# Patient Record
Sex: Female | Born: 1960
Health system: Southern US, Community
[De-identification: ages and names within clinical notes are randomized; demographics above are authoritative.]

## PROBLEM LIST (undated history)

## (undated) DIAGNOSIS — J302 Other seasonal allergic rhinitis: Secondary | ICD-10-CM

## (undated) DIAGNOSIS — R011 Cardiac murmur, unspecified: Secondary | ICD-10-CM

## (undated) DIAGNOSIS — E785 Hyperlipidemia, unspecified: Secondary | ICD-10-CM

## (undated) DIAGNOSIS — Z79890 Hormone replacement therapy (postmenopausal): Secondary | ICD-10-CM

## (undated) DIAGNOSIS — E039 Hypothyroidism, unspecified: Secondary | ICD-10-CM

## (undated) HISTORY — PX: LIPOMA EXCISION: SHX5283

## (undated) HISTORY — DX: Other seasonal allergic rhinitis: J30.2

## (undated) HISTORY — DX: Hyperlipidemia, unspecified: E78.5

## (undated) HISTORY — DX: Hormone replacement therapy: Z79.890

## (undated) HISTORY — DX: Hypothyroidism, unspecified: E03.9

## (undated) HISTORY — DX: Cardiac murmur, unspecified: R01.1

---

## 1998-03-21 ENCOUNTER — Other Ambulatory Visit: Admission: RE | Admit: 1998-03-21 | Discharge: 1998-03-21 | Payer: Self-pay | Admitting: Orthopedic Surgery

## 1998-11-30 HISTORY — PX: HAND SURGERY: SHX662

## 2000-01-01 ENCOUNTER — Other Ambulatory Visit: Admission: RE | Admit: 2000-01-01 | Discharge: 2000-01-01 | Payer: Self-pay | Admitting: Obstetrics & Gynecology

## 2001-04-26 ENCOUNTER — Encounter: Payer: Self-pay | Admitting: Internal Medicine

## 2002-01-23 ENCOUNTER — Other Ambulatory Visit: Admission: RE | Admit: 2002-01-23 | Discharge: 2002-01-23 | Payer: Self-pay | Admitting: Obstetrics and Gynecology

## 2003-01-24 ENCOUNTER — Other Ambulatory Visit: Admission: RE | Admit: 2003-01-24 | Discharge: 2003-01-24 | Payer: Self-pay | Admitting: Obstetrics and Gynecology

## 2003-12-01 HISTORY — PX: LIPOMA EXCISION: SHX5283

## 2004-01-21 ENCOUNTER — Other Ambulatory Visit: Admission: RE | Admit: 2004-01-21 | Discharge: 2004-01-21 | Payer: Self-pay | Admitting: Internal Medicine

## 2004-08-29 ENCOUNTER — Encounter: Payer: Self-pay | Admitting: Internal Medicine

## 2004-12-31 ENCOUNTER — Ambulatory Visit: Payer: Self-pay | Admitting: Internal Medicine

## 2005-01-07 ENCOUNTER — Ambulatory Visit: Payer: Self-pay | Admitting: Internal Medicine

## 2005-01-07 ENCOUNTER — Other Ambulatory Visit: Admission: RE | Admit: 2005-01-07 | Discharge: 2005-01-07 | Payer: Self-pay | Admitting: Internal Medicine

## 2005-01-13 ENCOUNTER — Encounter (HOSPITAL_COMMUNITY): Admission: RE | Admit: 2005-01-13 | Discharge: 2005-04-13 | Payer: Self-pay | Admitting: Internal Medicine

## 2005-02-23 ENCOUNTER — Encounter: Admission: RE | Admit: 2005-02-23 | Discharge: 2005-02-23 | Payer: Self-pay | Admitting: Endocrinology

## 2005-03-02 ENCOUNTER — Encounter: Payer: Self-pay | Admitting: Internal Medicine

## 2005-03-04 ENCOUNTER — Encounter: Admission: RE | Admit: 2005-03-04 | Discharge: 2005-03-04 | Payer: Self-pay | Admitting: Endocrinology

## 2005-03-04 ENCOUNTER — Encounter (INDEPENDENT_AMBULATORY_CARE_PROVIDER_SITE_OTHER): Payer: Self-pay | Admitting: *Deleted

## 2005-03-04 ENCOUNTER — Other Ambulatory Visit: Admission: RE | Admit: 2005-03-04 | Discharge: 2005-03-04 | Payer: Self-pay | Admitting: Interventional Radiology

## 2005-03-24 ENCOUNTER — Encounter (HOSPITAL_COMMUNITY): Admission: RE | Admit: 2005-03-24 | Discharge: 2005-06-22 | Payer: Self-pay | Admitting: Endocrinology

## 2006-01-04 ENCOUNTER — Ambulatory Visit: Payer: Self-pay | Admitting: Internal Medicine

## 2006-01-11 ENCOUNTER — Ambulatory Visit: Payer: Self-pay | Admitting: Internal Medicine

## 2006-01-11 ENCOUNTER — Encounter (INDEPENDENT_AMBULATORY_CARE_PROVIDER_SITE_OTHER): Payer: Self-pay | Admitting: *Deleted

## 2006-01-11 ENCOUNTER — Other Ambulatory Visit: Admission: RE | Admit: 2006-01-11 | Discharge: 2006-01-11 | Payer: Self-pay | Admitting: Internal Medicine

## 2006-01-20 ENCOUNTER — Ambulatory Visit: Payer: Self-pay | Admitting: Internal Medicine

## 2006-02-22 IMAGING — US US BIOPSY
1 series · 6 of 6 positions shown · non-contrast
Comparison: none

CLINICAL DATA: 43-year-old female with cold nodule mid to inferior aspect of the right lobe of the thyroid.  Request to perform fine needle aspiration. 
Comparison; (1.)  Nuclear medicine thyroid imaging and uptake 01/14/05 - [REDACTED]    (2.) Thyroid ultrasound 02/23/05 ? [REDACTED]. 
Procedure:
ULTRASOUND GUIDED FINE NEEDLE ASPIRATION RIGHT LOBE OF THE THYROID 03/04/06.  
The above procedure was discussed with the patient and written informed consent was obtained. 
Ultrasound was then performed to localize and mark an adequate site for the biopsy and this area was marked.  eviously described and imaged nodular lesion within the lateral aspect of the right lobe of the thyroid was imaged.  The maximum diameter measurement obtained was 6.3 mm.  The patient was prepped and draped in a normal sterile fashion, 1 percent Lidocaine was used for local anesthesia.  Using direct ultrasound guidance three passes were made using a 25 gauge hypodermic needle into this dominant nodule.  Ultrasound confirmed placement of the needle on all three occasions.  The specimens were given to pathology for further analysis.  Postprocedure imaging demonstrated no hematoma or immediate complication.  The patient tolerated the procedure well.

[Series 1: unknown · 0.07mm/px · 6 of 6 slices shown]
[im 1/6]
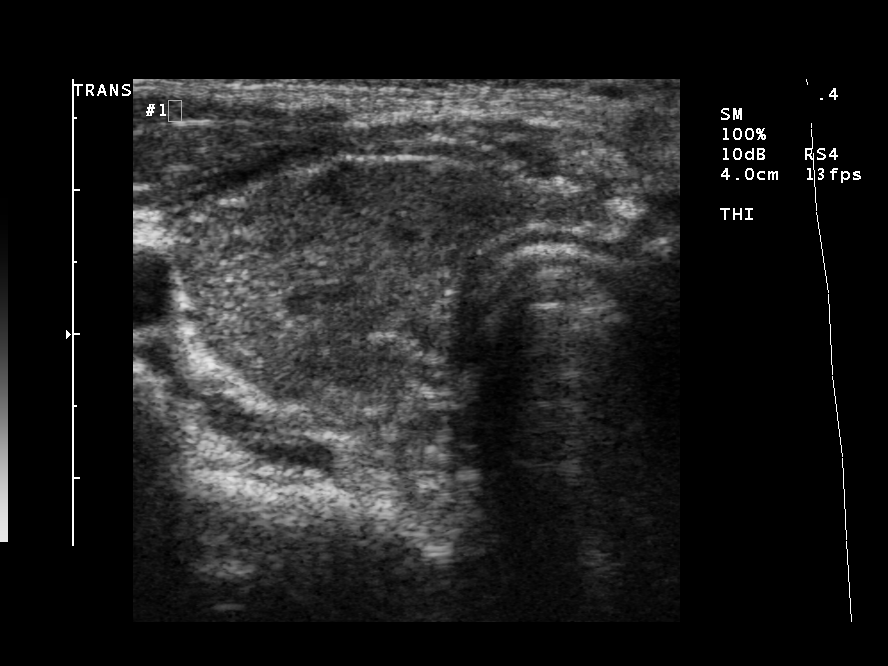
[im 2/6]
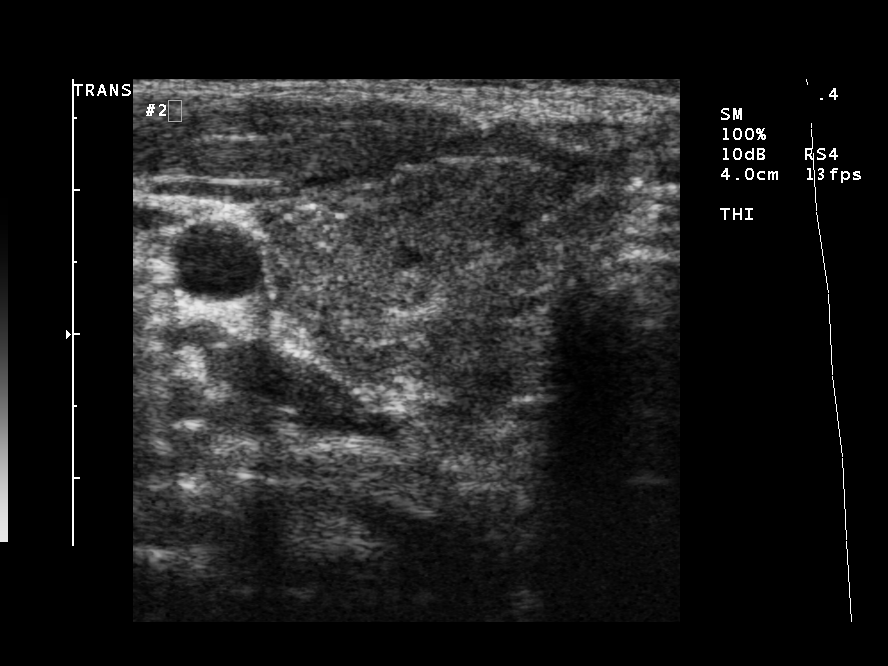
[im 3/6]
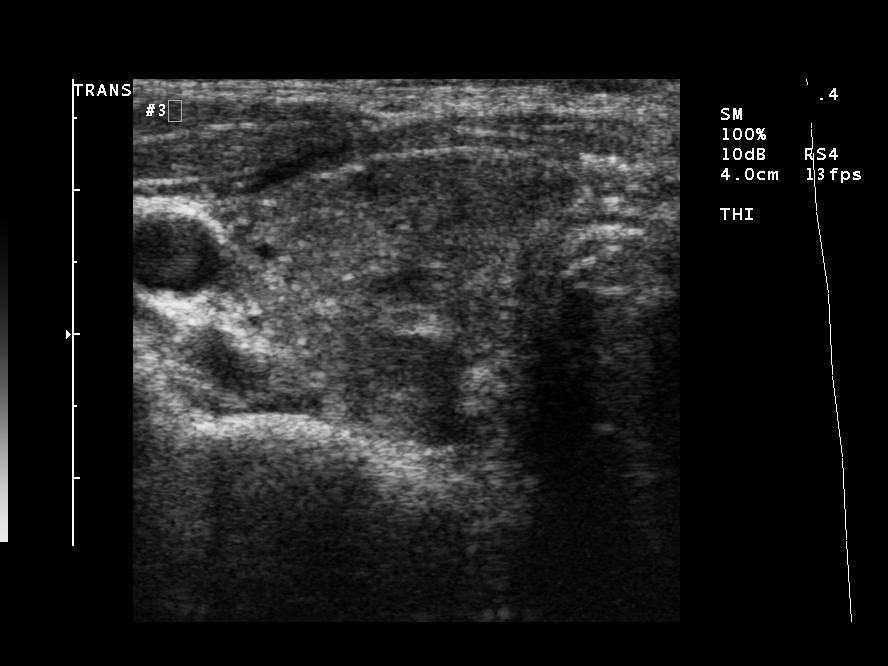
[im 4/6]
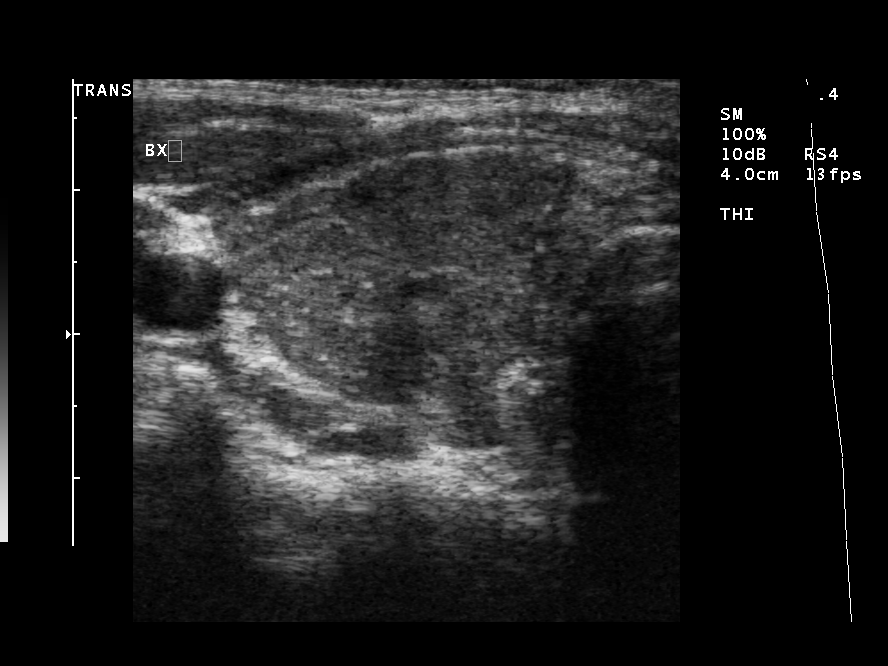
[im 5/6]
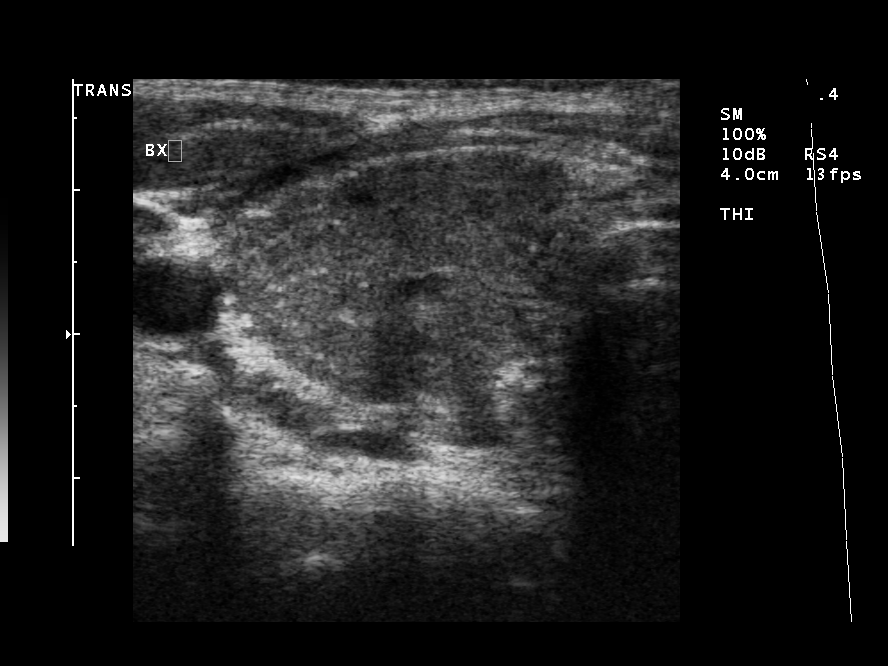
[im 6/6]
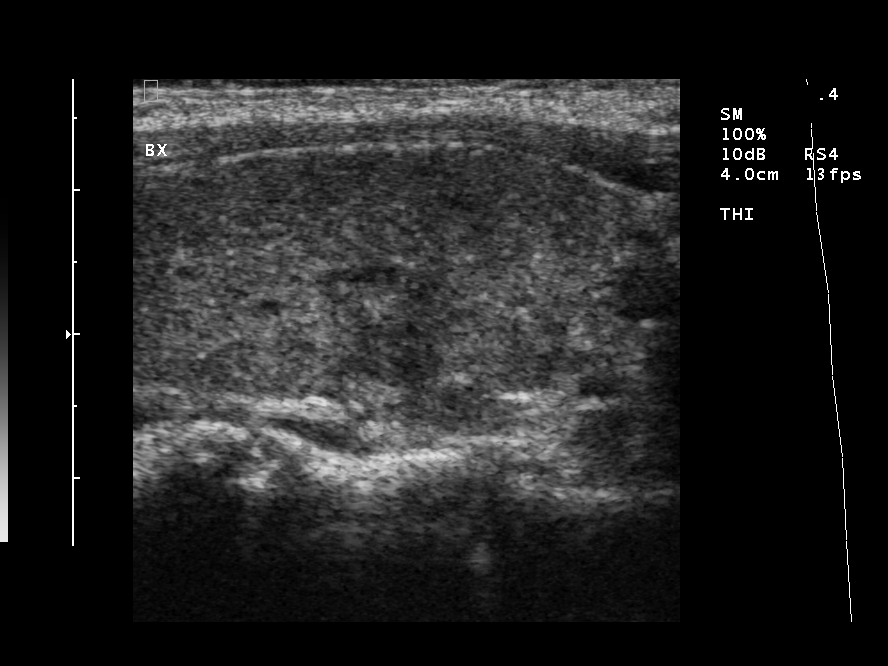

[6 of 6 positions shown; findings below may reference images not displayed]

IMPRESSION: Successful ultrasound guided fine needle aspiration right nodule of thyroid.  Final pathology pending.

## 2006-03-14 IMAGING — NM NM RAI THERAPY FOR HYPERTHYROIDISM
1 series · 1 of 1 positions shown · non-contrast
Comparison: none

CLINICAL DATA: 43 year old with Graves? disease.
 NUCLEAR MEDICINE RADIOACTIVE IODINE THERAPY FOR HYPERTHYROIDISM:
 The patient received an 0-TRT capsule which contained 15 millicuries of activity.  Appropriate precautions and instructions were reviewed with the patient.

[Series 1: st statics,dual det. · 2.48mm/px · 1 of 1 slices shown]
[im 1/1]
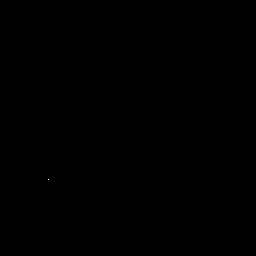

[1 of 1 positions shown; findings below may reference images not displayed]

IMPRESSION: Treatment of hyperthyroidism with 15 millicuries of 0-TRT orally.

## 2007-01-11 ENCOUNTER — Ambulatory Visit: Payer: Self-pay | Admitting: Internal Medicine

## 2007-01-11 LAB — CONVERTED CEMR LAB
Albumin: 3.4 g/dL — ABNORMAL LOW (ref 3.5–5.2)
Alkaline Phosphatase: 51 units/L (ref 39–117)
BUN: 10 mg/dL (ref 6–23)
Basophils Absolute: 0.1 10*3/uL (ref 0.0–0.1)
Cholesterol: 268 mg/dL (ref 0–200)
Creatinine, Ser: 0.9 mg/dL (ref 0.4–1.2)
GFR calc Af Amer: 87 mL/min
HDL: 65 mg/dL (ref >39.0)
Hemoglobin: 12.9 g/dL (ref 12.0–15.0)
MCHC: 34.2 g/dL (ref 30.0–36.0)
Monocytes Absolute: 0.6 10*3/uL (ref 0.2–0.7)
Monocytes Relative: 5.6 % (ref 3.0–11.0)
Potassium: 4.7 meq/L (ref 3.5–5.1)
RDW: 13.1 % (ref 11.5–14.6)
TSH: 5.4 microintl units/mL (ref 0.35–5.50)
Total Bilirubin: 0.7 mg/dL (ref 0.3–1.2)
Total CHOL/HDL Ratio: 4.1
Triglycerides: 205 mg/dL (ref 0–149)

## 2007-01-18 ENCOUNTER — Ambulatory Visit: Payer: Self-pay | Admitting: Internal Medicine

## 2007-01-18 ENCOUNTER — Other Ambulatory Visit: Admission: RE | Admit: 2007-01-18 | Discharge: 2007-01-18 | Payer: Self-pay | Admitting: Internal Medicine

## 2007-01-18 ENCOUNTER — Encounter: Payer: Self-pay | Admitting: Internal Medicine

## 2007-01-18 LAB — CONVERTED CEMR LAB

## 2007-05-25 ENCOUNTER — Encounter: Payer: Self-pay | Admitting: Internal Medicine

## 2007-05-25 DIAGNOSIS — E0590 Thyrotoxicosis, unspecified without thyrotoxic crisis or storm: Secondary | ICD-10-CM | POA: Insufficient documentation

## 2007-05-25 DIAGNOSIS — R319 Hematuria, unspecified: Secondary | ICD-10-CM | POA: Insufficient documentation

## 2007-07-12 ENCOUNTER — Ambulatory Visit: Payer: Self-pay | Admitting: Internal Medicine

## 2007-07-12 LAB — CONVERTED CEMR LAB
Cholesterol: 240 mg/dL (ref 0–200)
HDL: 54.5 mg/dL (ref >39.0)
Triglycerides: 167 mg/dL — ABNORMAL HIGH (ref 0–149)

## 2007-07-19 ENCOUNTER — Ambulatory Visit: Payer: Self-pay | Admitting: Internal Medicine

## 2007-07-19 DIAGNOSIS — E039 Hypothyroidism, unspecified: Secondary | ICD-10-CM | POA: Insufficient documentation

## 2007-07-19 DIAGNOSIS — E785 Hyperlipidemia, unspecified: Secondary | ICD-10-CM | POA: Insufficient documentation

## 2007-07-20 ENCOUNTER — Encounter: Payer: Self-pay | Admitting: Internal Medicine

## 2007-11-15 ENCOUNTER — Encounter: Payer: Self-pay | Admitting: Internal Medicine

## 2008-01-11 ENCOUNTER — Encounter: Payer: Self-pay | Admitting: Internal Medicine

## 2008-01-11 ENCOUNTER — Encounter (INDEPENDENT_AMBULATORY_CARE_PROVIDER_SITE_OTHER): Payer: Self-pay | Admitting: Surgery

## 2008-01-11 ENCOUNTER — Ambulatory Visit (HOSPITAL_BASED_OUTPATIENT_CLINIC_OR_DEPARTMENT_OTHER): Admission: RE | Admit: 2008-01-11 | Discharge: 2008-01-11 | Payer: Self-pay | Admitting: Surgery

## 2008-01-17 ENCOUNTER — Encounter: Payer: Self-pay | Admitting: Internal Medicine

## 2008-01-17 ENCOUNTER — Ambulatory Visit: Payer: Self-pay | Admitting: Family Medicine

## 2008-01-17 DIAGNOSIS — D179 Benign lipomatous neoplasm, unspecified: Secondary | ICD-10-CM | POA: Insufficient documentation

## 2008-01-17 LAB — CONVERTED CEMR LAB
Nitrite: NEGATIVE
Protein, U semiquant: NEGATIVE
Urobilinogen, UA: 0.2
WBC Urine, dipstick: NEGATIVE

## 2008-01-18 LAB — CONVERTED CEMR LAB
ALT: 17 units/L (ref 0–35)
AST: 18 units/L (ref 0–37)
Albumin: 3.4 g/dL — ABNORMAL LOW (ref 3.5–5.2)
Basophils Absolute: 0.1 10*3/uL (ref 0.0–0.1)
Calcium: 9.5 mg/dL (ref 8.4–10.5)
Chloride: 103 meq/L (ref 96–112)
Creatinine, Ser: 0.9 mg/dL (ref 0.4–1.2)
Direct LDL: 166.2 mg/dL
Eosinophils Relative: 2.7 % (ref 0.0–5.0)
Glucose, Bld: 94 mg/dL (ref 70–99)
HCT: 38 % (ref 36.0–46.0)
Neutrophils Relative %: 70.4 % (ref 43.0–77.0)
Platelets: 252 10*3/uL (ref 150–400)
RBC: 4.15 M/uL (ref 3.87–5.11)
RDW: 13.3 % (ref 11.5–14.6)
Sodium: 138 meq/L (ref 135–145)
Total Bilirubin: 0.5 mg/dL (ref 0.3–1.2)
Total CHOL/HDL Ratio: 3.9
Triglycerides: 157 mg/dL — ABNORMAL HIGH (ref 0–149)
WBC: 10.3 10*3/uL (ref 4.5–10.5)

## 2008-01-23 ENCOUNTER — Ambulatory Visit: Payer: Self-pay | Admitting: Internal Medicine

## 2008-01-23 ENCOUNTER — Encounter: Payer: Self-pay | Admitting: Internal Medicine

## 2008-01-23 ENCOUNTER — Other Ambulatory Visit: Admission: RE | Admit: 2008-01-23 | Discharge: 2008-01-23 | Payer: Self-pay | Admitting: Internal Medicine

## 2008-03-02 ENCOUNTER — Ambulatory Visit: Payer: Self-pay | Admitting: Internal Medicine

## 2008-03-04 LAB — CONVERTED CEMR LAB
Albumin: 3.4 g/dL — ABNORMAL LOW (ref 3.5–5.2)
Alkaline Phosphatase: 48 units/L (ref 39–117)
HDL: 58.9 mg/dL (ref >39.0)
LDL Cholesterol: 103 mg/dL — ABNORMAL HIGH (ref 0–99)
Total CHOL/HDL Ratio: 3.3
Total Protein: 6.7 g/dL (ref 6.0–8.3)
Triglycerides: 158 mg/dL — ABNORMAL HIGH (ref 0–149)
VLDL: 32 mg/dL (ref 0–40)

## 2008-03-12 ENCOUNTER — Ambulatory Visit: Payer: Self-pay | Admitting: Internal Medicine

## 2008-03-12 LAB — CONVERTED CEMR LAB
Cholesterol, target level: 200 mg/dL
HDL goal, serum: 40 mg/dL
LDL Goal: 160 mg/dL

## 2008-07-18 ENCOUNTER — Encounter: Payer: Self-pay | Admitting: Internal Medicine

## 2008-07-30 ENCOUNTER — Encounter: Payer: Self-pay | Admitting: Internal Medicine

## 2008-09-04 ENCOUNTER — Ambulatory Visit: Payer: Self-pay | Admitting: Internal Medicine

## 2008-09-04 LAB — CONVERTED CEMR LAB
ALT: 17 units/L (ref 0–35)
AST: 21 units/L (ref 0–37)
Alkaline Phosphatase: 53 units/L (ref 39–117)
Bilirubin, Direct: 0.1 mg/dL (ref 0.0–0.3)
Cholesterol: 194 mg/dL (ref 0–200)
Total Bilirubin: 0.5 mg/dL (ref 0.3–1.2)
Total Protein: 6.5 g/dL (ref 6.0–8.3)

## 2008-09-24 ENCOUNTER — Ambulatory Visit: Payer: Self-pay | Admitting: Internal Medicine

## 2008-09-24 DIAGNOSIS — R351 Nocturia: Secondary | ICD-10-CM | POA: Insufficient documentation

## 2009-01-29 ENCOUNTER — Ambulatory Visit: Payer: Self-pay | Admitting: Internal Medicine

## 2009-01-29 LAB — CONVERTED CEMR LAB
Albumin: 3.4 g/dL — ABNORMAL LOW (ref 3.5–5.2)
BUN: 13 mg/dL (ref 6–23)
Calcium: 9 mg/dL (ref 8.4–10.5)
Cholesterol: 206 mg/dL (ref 0–200)
Eosinophils Relative: 3.3 % (ref 0.0–5.0)
GFR calc Af Amer: 86 mL/min
GFR calc non Af Amer: 71 mL/min
Glucose, Bld: 97 mg/dL (ref 70–99)
HCT: 36.7 % (ref 36.0–46.0)
Hemoglobin: 12.4 g/dL (ref 12.0–15.0)
Lymphocytes Relative: 31.1 % (ref 12.0–46.0)
Monocytes Relative: 7.3 % (ref 3.0–12.0)
Nitrite: NEGATIVE
Platelets: 229 10*3/uL (ref 150–400)
Potassium: 3.9 meq/L (ref 3.5–5.1)
Protein, U semiquant: NEGATIVE
RDW: 12.8 % (ref 11.5–14.6)
Sodium: 139 meq/L (ref 135–145)
TSH: 1.08 microintl units/mL (ref 0.35–5.50)
Total CHOL/HDL Ratio: 3.6
Total Protein: 6.6 g/dL (ref 6.0–8.3)
Urobilinogen, UA: 0.2
VLDL: 32 mg/dL (ref 0–40)
WBC: 7.9 10*3/uL (ref 4.5–10.5)

## 2009-02-05 ENCOUNTER — Other Ambulatory Visit: Admission: RE | Admit: 2009-02-05 | Discharge: 2009-02-05 | Payer: Self-pay | Admitting: Internal Medicine

## 2009-02-05 ENCOUNTER — Encounter: Payer: Self-pay | Admitting: Internal Medicine

## 2009-02-05 ENCOUNTER — Ambulatory Visit: Payer: Self-pay | Admitting: Internal Medicine

## 2009-07-01 ENCOUNTER — Encounter: Payer: Self-pay | Admitting: Internal Medicine

## 2009-08-12 ENCOUNTER — Encounter: Payer: Self-pay | Admitting: Internal Medicine

## 2009-08-12 LAB — HM MAMMOGRAPHY

## 2009-08-20 ENCOUNTER — Encounter: Payer: Self-pay | Admitting: Internal Medicine

## 2010-02-03 ENCOUNTER — Ambulatory Visit: Payer: Self-pay | Admitting: Internal Medicine

## 2010-02-03 LAB — CONVERTED CEMR LAB
ALT: 16 units/L (ref 0–35)
AST: 19 units/L (ref 0–37)
Albumin: 3.3 g/dL — ABNORMAL LOW (ref 3.5–5.2)
Basophils Absolute: 0.1 10*3/uL (ref 0.0–0.1)
Bilirubin Urine: NEGATIVE
CO2: 28 meq/L (ref 19–32)
Chloride: 105 meq/L (ref 96–112)
Cholesterol: 175 mg/dL (ref 0–200)
GFR calc non Af Amer: 70.54 mL/min (ref >60)
Glucose, Bld: 104 mg/dL — ABNORMAL HIGH (ref 70–99)
Glucose, Urine, Semiquant: NEGATIVE
HCT: 37.7 % (ref 36.0–46.0)
Hemoglobin: 12.4 g/dL (ref 12.0–15.0)
Lymphs Abs: 2.2 10*3/uL (ref 0.7–4.0)
MCV: 96.2 fL (ref 78.0–100.0)
Monocytes Absolute: 0.5 10*3/uL (ref 0.1–1.0)
Monocytes Relative: 5.8 % (ref 3.0–12.0)
Neutro Abs: 5.9 10*3/uL (ref 1.4–7.7)
Potassium: 3.9 meq/L (ref 3.5–5.1)
RDW: 12.7 % (ref 11.5–14.6)
Sodium: 139 meq/L (ref 135–145)
Specific Gravity, Urine: 1.02
TSH: 0.88 microintl units/mL (ref 0.35–5.50)
VLDL: 40 mg/dL (ref 0.0–40.0)
WBC Urine, dipstick: NEGATIVE
pH: 6

## 2010-02-10 ENCOUNTER — Other Ambulatory Visit: Admission: RE | Admit: 2010-02-10 | Discharge: 2010-02-10 | Payer: Self-pay | Admitting: Internal Medicine

## 2010-02-10 ENCOUNTER — Ambulatory Visit: Payer: Self-pay | Admitting: Internal Medicine

## 2010-02-10 LAB — CONVERTED CEMR LAB
Ketones, urine, test strip: NEGATIVE
Nitrite: NEGATIVE
Specific Gravity, Urine: 1.015
WBC Urine, dipstick: NEGATIVE

## 2010-08-19 ENCOUNTER — Encounter: Payer: Self-pay | Admitting: Internal Medicine

## 2010-11-30 HISTORY — PX: COLONOSCOPY: SHX174

## 2010-12-21 ENCOUNTER — Encounter: Payer: Self-pay | Admitting: Internal Medicine

## 2011-01-01 NOTE — Assessment & Plan Note (Signed)
Summary: CPX - PAP / RS   Vital Signs:  Patient profile:   50 year old female Menstrual status:  regular LMP:     01/20/2010 Height:      60.25 inches Weight:      147 pounds BMI:     28.57 Pulse rate:   78 / minute BP sitting:   110 / 64  (right arm) Cuff size:   regular  Vitals Entered By: Romualdo Bolk, CMA (AAMA) (February 10, 2010 10:11 AM) CC: CPX with pap- Pt has had a dark discharge the past few days. Pt states that she has this right before her period the past few months. LMP (date): 01/20/2010 LMP - Character: normal Menarche (age onset years): 10   Menses interval (days): 28 Menstrual flow (days): 3 Menstrual Status regular Enter LMP: 01/20/2010 Last PAP Result NEGATIVE FOR INTRAEPITHELIAL LESIONS OR MALIGNANCY.   History of Present Illness: Sarah Floyd comesin for  preventive visit .  Since last  visit  doing  well.    no major changes in health status . Lipids : no se of meds doing well losing weight since January. Thyroid :  per dr balan no change .   Preventive Care Screening  Mammogram:    Date:  08/12/2009    Results:  normal   Prior Values:    Pap Smear:  NEGATIVE FOR INTRAEPITHELIAL LESIONS OR MALIGNANCY. (02/05/2009)    Mammogram:  normal bilateral (07/30/2008)    Last Tetanus Booster:  Historical (12/31/2004)   Preventive Screening-Counseling & Management  Alcohol-Tobacco     Alcohol drinks/day: 0     Smoking Status: quit  Caffeine-Diet-Exercise     Caffeine use/day: 2-3 coffee     Does Patient Exercise: yes     Type of exercise: walk     Exercise (avg: min/session): 30-60     Times/week: 7  Hep-HIV-STD-Contraception     Dental Visit-last 6 months yes     Sun Exposure-Excessive: no  Safety-Violence-Falls     Seat Belt Use: yes     Firearms in the Home: firearms in the home     Firearm Counseling: not indicated; uses recommended firearm safety measures     Smoke Detectors: yes      Blood Transfusions:  no.     EKG  Procedure date:  01/23/2008  Findings:      Normal sinus rhythm with rate of:  62  Current Medications (verified): 1)  Synthroid 125 Mcg  Tabs (Levothyroxine Sodium) .Marland Kitchen.. 1 By Mouth 4 Days A Week 2)  Sm Calcium/vitamin D 500-200 Mg-Unit Tabs (Calcium Carbonate-Vitamin D) .... Take 2 Tablet By Mouth Once A Day 3)  Ocella 3-0.03 Mg  Tabs (Drospirenone-Ethinyl Estradiol) .Marland Kitchen.. 1 By Mouth Once Daily 4)  Fish Oil   Oil (Fish Oil) 5)  Multiple Vitamins   Tabs (Multiple Vitamin) 6)  Synthroid 112 Mcg  Tabs (Levothyroxine Sodium) .Marland Kitchen.. 1 By Mouth On Friday, Sat and Sunday. 7)  Simvastatin 20 Mg  Tabs (Simvastatin) .Marland Kitchen.. 1 By Mouth Once Daily  Allergies (verified): No Known Drug Allergies  Past History:  Past medical, surgical, family and social histories (including risk factors) reviewed, and no changes noted (except as noted below).  Past Medical History: Hyperthyroidism rx ablation  on replacement  Dr Horald Pollen microscopic hematuria  evaluation    Dr Etta Grandchild 2001   Trigonitis. Ultrasound and cystoscopy  Past Surgical History: Reviewed history from 01/17/2008 and no changes required. Exvision of lipoma, left buttock, 3cm  Past  History:  Care Management: Endocrinology: Dr. Horald Pollen  Family History: Reviewed history from 02/05/2009 and no changes required. Family History High cholesterol both parents. Family History Diabetes 1st degree relative Mom.  [name] age 59  Family History Thyroid disease mom      Social History: Reviewed history from 02/05/2009 and no changes required. hh of 1    pet dog  Alcohol use-no Regular exercise-yes     Seat Belt Use:  yes Dental Care w/in 6 mos.:  yes Sun Exposure-Excessive:  no  Review of Systems  The patient denies anorexia, fever, weight loss, weight gain, vision loss, decreased hearing, hoarseness, syncope, dyspnea on exertion, peripheral edema, prolonged cough, headaches, hemoptysis, abdominal pain, melena, hematochezia, severe  indigestion/heartburn, incontinence, muscle weakness, transient blindness, difficulty walking, depression, unusual weight change, abnormal bleeding, enlarged lymph nodes, angioedema, and breast masses.         lipoma  ? come back.      Physical Exam General Appearance: well developed, well nourished, no acute distress Eyes: conjunctiva and lids normal, PERRLA, EOMI, WNL mild stare  Ears, Nose, Mouth, Throat: TM clear, nares clear, oral exam WNL Neck: supple, no lymphadenopathy, no thyromegaly, no JVD Respiratory: clear to auscultation and percussion, respiratory effort normal Cardiovascular: regular rate and rhythm, S1-S2, no murmur, rub or gallop, no bruits, peripheral pulses normal and symmetric, no cyanosis, clubbing, edema or varicosities Chest: no scars, masses, tenderness; no asymmetry, skin changes, nipple discharge   Gastrointestinal: soft, non-tender; no hepatosplenomegaly, masses; active bowel sounds all quadrants, guaiac negative stool; no masses, tenderness, hemorrhoids  Genitourinary: no vaginal discharge, lesions; no masses or tenderness  small amt of bleeding in vault  Lymphatic: no cervical, axillary or inguinal adenopathy Musculoskeletal: gait normal, muscle tone and strength WNL, no joint swelling, effusions, discoloration, crepitus  Skin: clear, good turgor, color WNL, no rashes, lesions, or ulcerations  left second toe with opaque lifted nail in triangle shape .  Neurologic: normal mental status, normal reflexes, normal strength, sensation, and motion Psychiatric: alert; oriented to person, place and time Other Exam:  see labs   bg 104 and tg 200 range     Impression & Recommendations:  Problem # 1:  PHYSICAL EXAMINATION (ICD-V70.0) normal   monitor nail  and if progressive  avoid trauma . counsel  .   healthy lifestyle intervention .  should call   for colonoscopy referral when 49 or at next check up.  Problem # 2:  ROUTINE GYNECOLOGICAL EXAMINATION (ICD-V72.31)  pap  done   on ocps no se    Orders: Pap Smear, Thin Prep ( Collection of) ([id number])  Problem # 3:  LIPOMA (ICD-214.9) small recurrent  quarter six  observe  .  Problem # 4:  HYPOTHYROIDISM (ICD-244.9) per Dr Talmage Nap Her updated medication list for this problem includes:    Synthroid 125 Mcg Tabs (Levothyroxine sodium) .Marland Kitchen... 1 by mouth 4 days a week    Synthroid 112 Mcg Tabs (Levothyroxine sodium) .Marland Kitchen... 1 by mouth on friday, sat and sunday.  Problem # 5:  HYPERLIPIDEMIA NEC/NOS (ICD-272.4) doing well but tg up this time/  actually doing better with symptom and losing weight   continue   .  on ocps but no change otherwise  Her updated medication list for this problem includes:    Simvastatin 20 Mg Tabs (Simvastatin) .Marland Kitchen... 1 by mouth once daily  Problem # 6:  BG   disc lifestyle intervention   Problem # 7:  HEMATURIA, MICROSCOPIC (ICD-599.7) hx of same  but has vag st potting today  could expalin this.   Problem # 8:  ORAL CONTRACEPTION (ICD-V25.41)  Complete Medication List: 1)  Synthroid 125 Mcg Tabs (Levothyroxine sodium) .Marland Kitchen.. 1 by mouth 4 days a week 2)  Sm Calcium/vitamin D 500-200 Mg-unit Tabs (Calcium carbonate-vitamin d) .... Take 2 tablet by mouth once a day 3)  Ocella 3-0.03 Mg Tabs (Drospirenone-ethinyl estradiol) .Marland Kitchen.. 1 by mouth once daily 4)  Fish Oil Oil (Fish oil) 5)  Multiple Vitamins Tabs (Multiple vitamin) 6)  Synthroid 112 Mcg Tabs (Levothyroxine sodium) .Marland Kitchen.. 1 by mouth on friday, sat and sunday. 7)  Simvastatin 20 Mg Tabs (Simvastatin) .... 1 by mouth once daily  Patient Instructions: 1)  continue healthy lifestyle and weight loss. 2)  in want to recheck lipids. before   next year.  Prescriptions: OCELLA 3-0.03 MG  TABS (DROSPIRENONE-ETHINYL ESTRADIOL) 1 by mouth once daily  #84 Tablet x 3   Entered and Authorized by:   Calah Gershman K Darice Vicario MD   Signed by:   Jolissa Kapral K Yancarlos Berthold MD on 02/10/2010   Method used:   Electronically to        [id number] ([facility])        [address]       [place], [place]  [address]       Ph: [phone] or [phone]       Fax: [phone]   RxID:   [id number] SIMVASTATIN 20 MG  TABS (SIMVASTATIN) 1 by mouth once daily  #90 Tablet x 3   Entered and Authorized by:   Lonzell Dorris K Monifa Blanchette MD   Signed by:   Dawud Mays K Rumaldo Difatta MD on 02/10/2010   Method used:   Electronically to        [id number] (ret[facility])       [address]       [place], [place]  [address]       Ph: [phone] or [phone]       Fax: [phone]   RxID:   [id number]    Laboratory Results   Urine Tests  Date/Time Received: February 10, 2010 10:39 AM   Routine Urinalysis   Color: yellow Appearance: Clear Glucose: negative   (Normal Range: Negative) Bilirubin: negative   (Normal Range: Negative) Ketone: negative   (Normal Range: Negative) Spec. Gravity: 1.015   (Normal Range: 1.003-1.035) Blood: moderate   (Normal Range: Negative) pH: 5.5   (Normal Range: 5.0-8.0) Protein: negative   (Normal Range: Negative) Urobilinogen: 0.2   (Normal Range: 0-1) Nitrite: negative   (Normal Range: Negative) Leukocyte Esterace: negative   (Normal Range: Negative)    Comments: Romualdo Bolk, CMA (AAMA)  February 10, 2010 10:39 AM

## 2011-01-31 ENCOUNTER — Other Ambulatory Visit: Payer: Self-pay | Admitting: Internal Medicine

## 2011-02-16 ENCOUNTER — Other Ambulatory Visit (INDEPENDENT_AMBULATORY_CARE_PROVIDER_SITE_OTHER): Payer: BC Managed Care – PPO | Admitting: Internal Medicine

## 2011-02-16 DIAGNOSIS — Z0000 Encounter for general adult medical examination without abnormal findings: Secondary | ICD-10-CM

## 2011-02-16 LAB — CBC WITH DIFFERENTIAL/PLATELET
Basophils Absolute: 0.1 10*3/uL (ref 0.0–0.1)
Lymphocytes Relative: 24.9 % (ref 12.0–46.0)
Lymphs Abs: 2.2 10*3/uL (ref 0.7–4.0)
Monocytes Relative: 6.4 % (ref 3.0–12.0)
Platelets: 217 10*3/uL (ref 150.0–400.0)
RDW: 13.6 % (ref 11.5–14.6)

## 2011-02-16 LAB — LIPID PANEL
Cholesterol: 193 mg/dL (ref 0–200)
LDL Cholesterol: 91 mg/dL (ref 0–99)
VLDL: 40 mg/dL (ref 0.0–40.0)

## 2011-02-16 LAB — HEPATIC FUNCTION PANEL
AST: 17 U/L (ref 0–37)
Alkaline Phosphatase: 48 U/L (ref 39–117)
Total Bilirubin: 0.3 mg/dL (ref 0.3–1.2)

## 2011-02-16 LAB — BASIC METABOLIC PANEL
BUN: 15 mg/dL (ref 6–23)
Calcium: 8.8 mg/dL (ref 8.4–10.5)
GFR: 79.32 mL/min (ref >60.00)
Glucose, Bld: 104 mg/dL — ABNORMAL HIGH (ref 70–99)

## 2011-02-19 ENCOUNTER — Encounter: Payer: Self-pay | Admitting: Internal Medicine

## 2011-02-23 ENCOUNTER — Encounter: Payer: Self-pay | Admitting: Internal Medicine

## 2011-02-23 ENCOUNTER — Ambulatory Visit (INDEPENDENT_AMBULATORY_CARE_PROVIDER_SITE_OTHER): Payer: BC Managed Care – PPO | Admitting: Internal Medicine

## 2011-02-23 VITALS — BP 120/80 | HR 60 | Ht 60.25 in | Wt 158.0 lb

## 2011-02-23 DIAGNOSIS — R319 Hematuria, unspecified: Secondary | ICD-10-CM

## 2011-02-23 DIAGNOSIS — Z1211 Encounter for screening for malignant neoplasm of colon: Secondary | ICD-10-CM

## 2011-02-23 DIAGNOSIS — E039 Hypothyroidism, unspecified: Secondary | ICD-10-CM

## 2011-02-23 DIAGNOSIS — E785 Hyperlipidemia, unspecified: Secondary | ICD-10-CM

## 2011-02-23 DIAGNOSIS — Z0000 Encounter for general adult medical examination without abnormal findings: Secondary | ICD-10-CM | POA: Insufficient documentation

## 2011-02-23 LAB — POCT URINALYSIS DIPSTICK
Glucose, UA: NEGATIVE
Spec Grav, UA: 1.005
Urobilinogen, UA: 0.2

## 2011-02-23 MED ORDER — DROSPIRENONE-ETHINYL ESTRADIOL 3-0.03 MG PO TABS: 1.0000 | ORAL_TABLET | Freq: Every day | ORAL | Status: DC

## 2011-02-23 MED ORDER — SIMVASTATIN 20 MG PO TABS: 20.0000 mg | ORAL_TABLET | Freq: Every day | ORAL | Status: DC

## 2011-02-23 NOTE — Patient Instructions (Signed)
Ok to continue   [employer] for now .     Consider dc at age 50 or so. Marland KitchenAvoid animal fats , trans fats simple sugars sweets  and carbohydrates. return office visit 1 year or prn  Hypertriglyceridemia  Diet for High blood levels of Triglycerides Most fats in food are triglycerides. Triglycerides in your blood are stored as fat in your body. High levels of triglycerides in your blood may put you at a greater risk for heart disease and stroke.  Normal triglyceride levels are less than 150 mg/dL. Borderline high levels are 150-199 mg/dl. High levels are 200 - 499 mg/dL, and very high triglyceride levels are greater than 500 mg/dL. The decision to treat high triglycerides is generally based on the level. For people with borderline or high triglyceride levels, treatment includes weight loss and exercise. Drugs are recommended for people with very high triglyceride levels. Many people who need treatment for high triglyceride levels have metabolic syndrome. This syndrome is a collection of disorders that often include: insulin resistance, high blood pressure, blood clotting problems, high cholesterol and triglycerides. TESTING PROCEDURE FOR TRIGLYCERIDES  You should not eat 4 hours before getting your triglycerides measured. The normal range of triglycerides is between 10 and 250 milligrams per deciliter (mg/dl). Some people may have extreme levels (1000 or above), but your triglyceride level may be too high if it is above 150 mg/dl, depending on what other risk factors you have for heart disease.   People with high blood triglycerides may also have high blood cholesterol levels. If you have high blood cholesterol as well as high blood triglycerides, your risk for heart disease is probably greater than if you only had high triglycerides. High blood cholesterol is one of the main risk factors for heart disease.  CHANGING YOUR DIET  Your weight can affect your blood triglyceride level. If you are more than 20%  above your ideal body weight, you may be able to lower your blood triglycerides by losing weight. Eating less and exercising regularly is the best way to combat this. Fat provides more calories than any other food. The best way to lose weight is to eat less fat. Only 30% of your total calories should come from fat. Less than 7% of your diet should come from saturated fat. A diet low in fat and saturated fat is the same as a diet to decrease blood cholesterol. By eating a diet lower in fat, you may lose weight, lower your blood cholesterol, and lower your blood triglyceride level.  Eating a diet low in fat, especially saturated fat, may also help you lower your blood triglyceride level. Ask your dietitian to help you figure how much fat you can eat based on the number of calories your caregiver has prescribed for you.  Exercise, in addition to helping with weight loss may also help lower triglyceride levels.   Alcohol can increase blood triglycerides. You may need to stop drinking alcoholic beverages.   Too much carbohydrate in your diet may also increase your blood triglycerides. Some complex carbohydrates are necessary in your diet. These may include bread, rice, potatoes, other starchy vegetables and cereals.   Reduce "simple" carbohydrates. These may include pure sugars, candy, honey, and jelly without losing other nutrients. If you have the kind of high blood triglycerides that is affected by the amount of carbohydrates in your diet, you will need to eat less sugar and less high-sugar foods. Your caregiver can help you with this.   Adding  2-4 grams of fish oil (EPA+ DHA) may also help lower triglycerides. Speak with your caregiver before adding any supplements to your regimen.  Following the Diet  Maintain your ideal weight. Your caregivers can help you with a diet. Generally, eating less food and getting more exercise will help you lose weight. Joining a weight control group may also help. Ask your  caregivers for a good weight control group in your area.  Eat low-fat foods instead of high-fat foods. This can help you lose weight too.  These foods are lower in fat. Eat MORE of these:   Dried beans, peas, and lentils.   Egg whites.   Low-fat cottage cheese.   Fish.   Lean cuts of meat, such as round, sirloin, rump, and flank (cut extra fat off meat you fix).   Whole grain breads, cereals and pasta.   Skim and nonfat dry milk.   Low-fat yogurt.   Poultry without the skin.   Cheese made with skim or part-skim milk, such as mozzarella, parmesan, farmers', ricotta, or pot cheese.   These are higher fat foods. Eat LESS of these:   Whole milk and foods made from whole milk, such as American, blue, cheddar, monterey jack, and swiss cheese   High-fat meats, such as luncheon meats, sausages, knockwurst, bratwurst, hot dogs, ribs, corned beef, ground pork, and regular ground beef.   Fried foods.  Limit saturated fats in your diet. Substituting unsaturated fat for saturated fat may decrease your blood triglyceride level. You will need to read package labels to know which products contain saturated fats.  These foods are high in saturated fat. Eat LESS of these:   Fried pork skins.  Whole milk.   Skin and fat from poultry.   Palm oil.   Butter.   Shortening.   Cream cheese.   Tomasa Blase.   Margarines and baked goods made from listed oils.   Vegetable shortenings.   Chitterlings.  Fat from meats.   Coconut oil.   Palm kernel oil.   Lard.   Cream.   Sour cream.   Fatback.   Coffee whiteners and non-dairy creamers made with these oils.   Cheese made from whole milk.   Use unsaturated fats (both polyunsaturated and monounsaturated) moderately. Remember, even though unsaturated fats are better than saturated fats; you still want a diet low in total fat.  These foods are high in unsaturated fat:   Canola oil.  Sunflower oil.   Mayonnaise.   Almonds.    Peanuts.   Pine nuts.   Margarines made with these oils.   Safflower oil.  Olive oil.   Avocados.   Cashews.   Peanut butter.   Sunflower seeds.   Soybean oil.  Peanut oil.   Olives.   Pecans.   Walnuts.   Pumpkin seeds.   Avoid sugar and other high-sugar foods. This will decrease carbohydrates without decreasing other nutrients. Sugar in your food goes rapidly to your blood. When there is excess sugar in your blood, your liver may use it to make more triglycerides. Sugar also contains calories without other important nutrients.  Eat LESS of these:   Sugar, brown sugar, powdered sugar, jam, jelly, preserves, honey, syrup, molasses, pies, candy, cakes, cookies, frosting, pastries, colas, soft drinks, punches, fruit drinks, and regular gelatin.   Avoid alcohol. Alcohol, even more than sugar, may increase blood triglycerides. In addition, alcohol is high in calories and low in nutrients. Ask for sparkling water, or a diet soft drink instead  of an alcoholic beverage.  Suggestions for planning and preparing meals   Bake, broil, grill or roast meats instead of frying.   Remove fat from meats and skin from poultry before cooking.   Add spices, herbs, lemon juice or vinegar to vegetables instead of salt, rich sauces or gravies.   Use a non-stick skillet without fat or use no-stick sprays.   Cool and refrigerate stews and broth. Then remove the hardened fat floating on the surface before serving.   Refrigerate meat drippings and skim off fat to make low-fat gravies.   Serve more fish.   Use less butter, margarine and other high-fat spreads on bread or vegetables.   Use skim or reconstituted non-fat dry milk for cooking.   Cook with low-fat cheeses.   Substitute low-fat yogurt or cottage cheese for all or part of the sour cream in recipes for sauces, dips or congealed salads.   Use half yogurt/half mayonnaise in salad recipes.   Substitute evaporated skim milk for  cream. Evaporated skim milk or reconstituted non-fat dry milk can be whipped and substituted for whipped cream in certain recipes.   Choose fresh fruits for dessert instead of high-fat foods such as pies or cakes. Fruits are naturally low in fat.  When Dining Out   Order low-fat appetizers such as fruit or vegetable juice, pasta with vegetables or tomato sauce.   Select clear, rather than cream soups.   Ask that dressings and gravies be served on the side. Then use less of them.   Order foods that are baked, broiled, poached, steamed, stir-fried, or roasted.   Ask for margarine instead of butter, and use only a small amount.   Drink sparkling water, unsweetened tea or coffee, or diet soft drinks instead of alcohol or other sweet beverages.  QUESTIONS AND ANSWERS ABOUT OTHER FATS IN THE BLOOD:  SATURATED FAT, TRANS FAT, AND CHOLESTEROL What is trans fat? Trans fat is a type of fat that is formed when vegetable oil is hardened through a process called hydrogenation. This process helps makes foods more solid, gives them shape, and prolongs their shelf life. Trans fats are also called hydrogenated or partially hydrogenated oils.  What do saturated fat, trans fat, and cholesterol in foods have to do with heart disease? Saturated fat, trans fat, and cholesterol in the diet all raise the level of LDL "bad" cholesterol in the blood. The higher the LDL cholesterol, the greater the risk for coronary heart disease (CHD). Saturated fat and trans fat raise LDL similarly.  What foods contain saturated fat, trans fat, and cholesterol? High amounts of saturated fat are found in animal products, such as fatty cuts of meat, chicken skin, and full-fat dairy products like butter, whole milk, cream, and cheese, and in tropical vegetable oils such as palm, palm kernel, and coconut oil. Trans fat is found in some of the same foods as saturated fat, such as vegetable shortening, some margarines (especially hard or  stick margarine), crackers, cookies, baked goods, fried foods, salad dressings, and other processed foods made with partially hydrogenated vegetable oils. Small amounts of trans fat also occur naturally in some animal products, such as milk products, beef, and lamb. Foods high in cholesterol include liver, other organ meats, egg yolks, shrimp, and full-fat dairy products. How can I use the new food label to make heart-healthy food choices? Check the Nutrition Facts panel of the food label. Choose foods lower in saturated fat, trans fat, and cholesterol. For saturated fat and cholesterol,  you can also use the Percent Daily Value (%DV): 5% DV or less is low, and 20% DV or more is high. (There is no %DV for trans fat.) Use the Nutrition Facts panel to choose foods low in saturated fat and cholesterol, and if the trans fat is not listed, read the ingredients and limit products that list shortening or hydrogenated or partially hydrogenated vegetable oil, which tend to be high in trans fat. POINTS TO REMEMBER: YOU NEED A LITTLE TLC (THERAPEUTIC LIFESTYLE CHANGES)  Discuss your risk for heart disease with your caregivers, and take steps to reduce risk factors.   Change your diet. Choose foods that are low in saturated fat, trans fat, and cholesterol.   Add exercise to your daily routine if it is not already being done. Participate in physical activity of moderate intensity, like brisk walking, for at least 30 minutes on most, and preferably all days of the week. No time? Break the 30 minutes into three, 10-minute segments during the day.   Stop smoking. If you do smoke, contact your caregiver to discuss ways in which they can help you quit.   Do not use street drugs.   Maintain a normal weight.   Maintain a healthy blood pressure.   Keep up with your blood work for checking the fats in your blood as directed by your caregiver.  Document Released: 09/03/2004 Document Re-Released: 05/06/2010 [facility]  Patient Information 2011 [place], [place].

## 2011-03-01 ENCOUNTER — Encounter: Payer: Self-pay | Admitting: Internal Medicine

## 2011-03-01 NOTE — Assessment & Plan Note (Signed)
Hx of same previous eval by urology

## 2011-03-01 NOTE — Progress Notes (Signed)
Subjective:    Patient ID: Sarah Floyd, female    DOB: Nov 17, 1961, 50 y.o.   MRN: [id number]  HPI Patient comes in today for a routine healthcare maintenance visit and follow up on her medications. Since her last visit there's been no major change in her health status and she feels like she's doing well. Hypothyroid: she's continuing on her Synthroid 112 Sunday and Saturday on and 1250 Monday through Friday she is followed by Dr. Talmage Nap.  Hyperlipidemia:  Still taking the simvastatin no side effects. Also doing healthy lifestyle intervention. OCPS;  Currently still on Ocella has noted hot flashes during the week of inert pills questions how long she should stay on this she's currently not using it for contraception.  She had a Pap smear last year and has had regular Pap smears throughout her life with no abnormalities or interventions needed. She is up to date on her healthcare maintenance.   Review of Systems Negative for major changes in vision hearing chest pain shortness of breath pleading unusual joint pains syncope rest  Neg or as per HPI. Past Medical History  Diagnosis Date  . Hypothyroid     Sp ablation  on meds per Dr Talmage Nap  . Hematuria     Dr Etta Grandchild 2001 Trigonitis. Ultrasound and cystoscopy  . Hyperlipidemia    Past Surgical History  Procedure Date  . Lipoma excision     left buttock 3 cm    reports that she has quit smoking. She does not have any smokeless tobacco history on file. She reports that she does not drink alcohol or use illicit drugs. family history includes Diabetes in an unspecified family member; Heart attack (age of onset:69) in an unspecified family member; Hyperlipidemia in an unspecified family member; and Thyroid disease in an unspecified family member. No Known Allergies     Objective:   Physical Exam Physical Exam: Vital signs reviewed XBM:[id number] is a well-developed well-nourished alert cooperative  white female who appears her stated age in  no acute distress.  HEENT: normocephalic  traumatic , Eyes: PERRL EOM's full, conjunctiva clear, Nares: paten,t no deformity discharge or tenderness., Ears: no deformity EAC's clear TMs with normal landmarks. Mouth: clear OP, no lesions, edema.  Moist mucous membranes. Dentition in adequate repair. NECK: supple without masses, thyromegaly or bruits. Breast: normal by inspection . No dimpling, discharge, masses, tenderness or discharge .  CHEST/PULM:  Clear to auscultation and percussion breath sounds equal no wheeze , rales or rhonchi. No chest wall deformities or tenderness. CV: PMI is nondisplaced, S1 S2 no gallops, murmurs, rubs. Peripheral pulses are full without delay.No JVD .  ABDOMEN: Bowel sounds normal nontender  No guard or rebound, no hepato splenomegal no CVA tenderness.  No hernia. Extremtities:  No clubbing cyanosis or edema, no acute joint swelling or redness no focal atrophy NEURO:  Oriented x3, cranial nerves 3-12 appear to be intact, no obvious focal weakness,gait within normal limits no abnormal reflexes or asymmetrical SKIN: No acute rashes normal turgor, color, no bruising or petechiae. PSYCH: Oriented, good eye contact, no obvious depression anxiety, cognition and judgment appear normal. LN: no cervical axillary inguinal adenopathy.    Labs reviewed   With patient  Assessment & Plan:  Preventive Health Care We can do Pap smear next year not indicated this year. Refer for colonoscopy. Counseled. Continue lifestyle intervention healthy eating and exercise .  Hyperlipidemia:   No se of meds   Family hx of heart disease  No other risk  factors . Thyroid disease stable .  Per endocrine.  OCPS: no ci to meds  And has been doing well.  Discussed downshifting at age [age] and or stopping  .    She does have some hot flushed on the  Off week.    consider stopping these next year or as needed.  Microscopic Hematuria    Ongoing  neg sig  uro evaluation in the past.

## 2011-03-25 ENCOUNTER — Encounter: Payer: Self-pay | Admitting: Gastroenterology

## 2011-03-25 ENCOUNTER — Ambulatory Visit (AMBULATORY_SURGERY_CENTER): Payer: BC Managed Care – PPO | Admitting: *Deleted

## 2011-03-25 VITALS — Ht 60.0 in | Wt 159.7 lb

## 2011-03-25 DIAGNOSIS — Z1211 Encounter for screening for malignant neoplasm of colon: Secondary | ICD-10-CM

## 2011-03-25 MED ORDER — PEG-KCL-NACL-NASULF-NA ASC-C 100 G PO SOLR: ORAL | Status: DC

## 2011-04-07 ENCOUNTER — Encounter: Payer: Self-pay | Admitting: Gastroenterology

## 2011-04-08 ENCOUNTER — Encounter: Payer: Self-pay | Admitting: Gastroenterology

## 2011-04-08 ENCOUNTER — Ambulatory Visit (AMBULATORY_SURGERY_CENTER): Payer: BC Managed Care – PPO | Admitting: Gastroenterology

## 2011-04-08 VITALS — BP 113/74 | HR 74 | Temp 97.9°F | Resp 18 | Ht 60.0 in | Wt 158.0 lb

## 2011-04-08 DIAGNOSIS — Z1211 Encounter for screening for malignant neoplasm of colon: Secondary | ICD-10-CM

## 2011-04-08 DIAGNOSIS — K5730 Diverticulosis of large intestine without perforation or abscess without bleeding: Secondary | ICD-10-CM

## 2011-04-08 MED ORDER — SODIUM CHLORIDE 0.9 % IV SOLN: 500.0000 mL | INTRAVENOUS | Status: DC

## 2011-04-08 NOTE — Patient Instructions (Signed)
Follow your discharge instructions.  Resume your medications.  Next colonoscopy in 10 years.

## 2011-04-09 ENCOUNTER — Telehealth: Payer: Self-pay | Admitting: *Deleted

## 2011-04-09 NOTE — Telephone Encounter (Signed)

## 2011-04-14 NOTE — Op Note (Signed)
NAMEELISA, Sarah Floyd             ACCOUNT NO.:  [id number]   MEDICAL RECORD NO.:  [id number]          PATIENT TYPE:  AMB   LOCATION:  [facility]                          FACILITY:  [facility]   PHYSICIAN:  Wilmon Arms. Corliss Skains, M.D. DATE OF BIRTH:  Aug 10, 1961   DATE OF PROCEDURE:  01/11/2008  DATE OF DISCHARGE:                               OPERATIVE REPORT   PREOPERATIVE DIAGNOSIS:  Lipoma, left buttock.   POSTOPERATIVE DIAGNOSIS:  Lipoma, left buttock.   PROCEDURE:  Excision of a lipoma, left buttock, 3 cm.   SURGEON:  Wilmon Arms. Tsuei, M.D.   ANESTHESIA:  Local MAC.   INDICATIONS:  The patient is a 50 year old female who presents with a  small mass on her left buttock which has become larger and causes some  discomfort.  She presents for elective excision.   DESCRIPTION OF PROCEDURE:  The patient was brought to the operating room  and placed in a lateral position on the operating table.  She was lying  on her left side.  After she was given some intravenous sedation, her  left buttock was prepped with Betadine and draped in sterile fashion.  A  time-out was taken to ensure the proper patient and proper procedure.  Marcaine 0.25% with epinephrine was used to infiltrate all around the  visible lipoma.  An elliptical incision was made around the base of the  lipoma.  We dissected down on the subcutaneous tissues and dissect  around a lipoma measuring about 3 cm in total length; this was sent for  pathologic examination.  Cautery was used for hemostasis.  The wound was  closed with a dermal layer of 3-0 Vicryl.  Dermabond was used to close  the skin.  The patient was then awakened and brought to Recovery in  stable condition.  All sponge, instrument, and needle counts were  correct.      Wilmon Arms. Tsuei, M.D.  Electronically Signed     MKT/MEDQ  D:  01/11/2008  T:  01/12/2008  Job:  [id number]   cc:   Neta Mends. Fabian Sharp, MD

## 2011-06-17 ENCOUNTER — Telehealth: Payer: Self-pay

## 2011-06-17 NOTE — Telephone Encounter (Signed)
Pt had CPE done in march and wants to know if a form that includes biometric screening can be filled out for her job.  Advised pt to bring form to office to be completed and if there is a problem and she happens to need an appointment, then she will receive a call

## 2011-08-28 ENCOUNTER — Encounter: Payer: Self-pay | Admitting: Internal Medicine

## 2011-08-28 ENCOUNTER — Ambulatory Visit (INDEPENDENT_AMBULATORY_CARE_PROVIDER_SITE_OTHER): Payer: BC Managed Care – PPO | Admitting: Internal Medicine

## 2011-08-28 VITALS — BP 124/84 | Temp 98.8°F | Wt 154.0 lb

## 2011-08-28 DIAGNOSIS — J069 Acute upper respiratory infection, unspecified: Secondary | ICD-10-CM

## 2011-08-28 NOTE — Patient Instructions (Signed)
Please call our office if your symptoms do not improve or gets worse.  

## 2011-08-28 NOTE — Assessment & Plan Note (Signed)
We discussed pt likely has viral URI.  Symptomatic treatment reviewed.  Patient advised to call office if symptoms persist or worsen.

## 2011-08-28 NOTE — Progress Notes (Signed)
  Subjective:    Patient ID: Sarah Floyd, female    DOB: 28-Feb-1961, 50 y.o.   MRN: [id number]  URI  This is a new problem. The current episode started today. The problem has been unchanged. There has been no fever. Associated symptoms include congestion, coughing and a sore throat. Pertinent negatives include no abdominal pain. She has tried nothing for the symptoms.      Review of Systems  HENT: Positive for congestion and sore throat.   Respiratory: Positive for cough.   Gastrointestinal: Negative for abdominal pain.       Past Medical History  Diagnosis Date  . Hypothyroid     Sp ablation  on meds per Dr Talmage Nap  . Hematuria     Dr Etta Grandchild 2001 Trigonitis. Ultrasound and cystoscopy  . Hyperlipidemia     History   Social History  . Marital Status: Single    Spouse Name: N/A    Number of Children: N/A  . Years of Education: N/A   Occupational History  . Not on file.   Social History Main Topics  . Smoking status: Former Games developer  . Smokeless tobacco: Not on file  . Alcohol Use: No  . Drug Use: No  . Sexually Active: Not on file   Other Topics Concern  . Not on file   Social History Narrative   hh 1Pet dog Exercise  No etoh.    Past Surgical History  Procedure Date  . Lipoma excision     left buttock 3 cm    Family History  Problem Relation Age of Onset  . Diabetes    . Hyperlipidemia    . Heart attack  29    father  . Thyroid disease      mom  . Diabetes Mother     No Known Allergies  Current Outpatient Prescriptions on File Prior to Visit  Medication Sig Dispense Refill  . Calcium Carbonate-Vitamin D (CALCIUM-VITAMIN D) 500-200 MG-UNIT per tablet Take 2 tablets by mouth daily.        . drospirenone-ethinyl estradiol (OCELLA) 3-0.03 MG per tablet Take 1 tablet by mouth daily.  84 tablet  3  . levothyroxine (SYNTHROID, LEVOTHROID) 112 MCG tablet Take 112 mcg by mouth. Takes on SAT SUN      . levothyroxine (SYNTHROID, LEVOTHROID) 125 MCG tablet  Take 125 mcg by mouth. Takes 5 days week      . Multiple Vitamin (MULTIVITAMIN) tablet Take 1 tablet by mouth daily.        . simvastatin (ZOCOR) 20 MG tablet Take 1 tablet (20 mg total) by mouth at bedtime.  90 tablet  3    BP 124/84  Temp(Src) 98.8 F (37.1 C) (Oral)  Wt 154 lb (69.854 kg)    Objective:   Physical Exam   Constitutional: Appears well-developed and well-nourished. No distress.  Head: Normocephalic and atraumatic.  Right Ear: External ear normal.  Left Ear: External ear normal.  Mouth/Throat: Oropharynx slightly red Eyes: Conjunctivae are normal. Pupils are equal, round, and reactive to light.  Neck: Normal range of motion. Neck supple. No thyromegaly present. No tenderness, no adenopathy Cardiovascular: Normal rate, regular rhythm and normal heart sounds.  Exam reveals no gallop and no friction rub.   Pulmonary/Chest: Effort normal and breath sounds normal.  No wheezes. No rales.       Assessment & Plan:

## 2011-12-29 ENCOUNTER — Telehealth: Payer: Self-pay | Admitting: *Deleted

## 2011-12-29 MED ORDER — DROSPIRENONE-ETHINYL ESTRADIOL 3-0.03 MG PO TABS: 1.0000 | ORAL_TABLET | Freq: Every day | ORAL | Status: DC

## 2011-12-29 NOTE — Telephone Encounter (Signed)
Refill on ocella  

## 2012-02-09 ENCOUNTER — Other Ambulatory Visit: Payer: BC Managed Care – PPO

## 2012-02-17 ENCOUNTER — Other Ambulatory Visit (INDEPENDENT_AMBULATORY_CARE_PROVIDER_SITE_OTHER): Payer: 59

## 2012-02-17 DIAGNOSIS — Z0000 Encounter for general adult medical examination without abnormal findings: Secondary | ICD-10-CM

## 2012-02-17 LAB — CBC WITH DIFFERENTIAL/PLATELET
Basophils Absolute: 0.1 10*3/uL (ref 0.0–0.1)
Eosinophils Relative: 4 % (ref 0.0–5.0)
HCT: 37.8 % (ref 36.0–46.0)
Hemoglobin: 12.5 g/dL (ref 12.0–15.0)
Lymphocytes Relative: 31.4 % (ref 12.0–46.0)
Lymphs Abs: 2.6 10*3/uL (ref 0.7–4.0)
Monocytes Relative: 5.7 % (ref 3.0–12.0)
Platelets: 244 10*3/uL (ref 150.0–400.0)
WBC: 8.4 10*3/uL (ref 4.5–10.5)

## 2012-02-17 LAB — HEPATIC FUNCTION PANEL
ALT: 14 U/L (ref 0–35)
AST: 17 U/L (ref 0–37)
Albumin: 3.3 g/dL — ABNORMAL LOW (ref 3.5–5.2)
Alkaline Phosphatase: 55 U/L (ref 39–117)

## 2012-02-17 LAB — POCT URINALYSIS DIPSTICK
Bilirubin, UA: NEGATIVE
Glucose, UA: NEGATIVE
Spec Grav, UA: 1.025
Urobilinogen, UA: 0.2
pH, UA: 5.5

## 2012-02-17 LAB — BASIC METABOLIC PANEL
BUN: 15 mg/dL (ref 6–23)
Calcium: 8.8 mg/dL (ref 8.4–10.5)
GFR: 73.73 mL/min (ref >60.00)
Potassium: 4.1 mEq/L (ref 3.5–5.1)
Sodium: 137 mEq/L (ref 135–145)

## 2012-02-17 LAB — LIPID PANEL
HDL: 62.5 mg/dL (ref >39.00)
LDL Cholesterol: 64 mg/dL (ref 0–99)
Total CHOL/HDL Ratio: 3
Triglycerides: 175 mg/dL — ABNORMAL HIGH (ref 0.0–149.0)

## 2012-02-23 ENCOUNTER — Other Ambulatory Visit (HOSPITAL_COMMUNITY)
Admission: RE | Admit: 2012-02-23 | Discharge: 2012-02-23 | Disposition: A | Discharge status: Home / Self Care | Payer: 59 | Source: Ambulatory Visit | Attending: Internal Medicine | Admitting: Internal Medicine

## 2012-02-23 ENCOUNTER — Encounter: Payer: Self-pay | Admitting: Internal Medicine

## 2012-02-23 ENCOUNTER — Other Ambulatory Visit: Payer: Self-pay | Admitting: Internal Medicine

## 2012-02-23 ENCOUNTER — Ambulatory Visit (INDEPENDENT_AMBULATORY_CARE_PROVIDER_SITE_OTHER): Payer: 59 | Admitting: Internal Medicine

## 2012-02-23 VITALS — BP 120/80 | HR 78 | Ht 60.0 in | Wt 149.0 lb

## 2012-02-23 DIAGNOSIS — Z01419 Encounter for gynecological examination (general) (routine) without abnormal findings: Secondary | ICD-10-CM | POA: Insufficient documentation

## 2012-02-23 DIAGNOSIS — K644 Residual hemorrhoidal skin tags: Secondary | ICD-10-CM

## 2012-02-23 DIAGNOSIS — Z0000 Encounter for general adult medical examination without abnormal findings: Secondary | ICD-10-CM

## 2012-02-23 DIAGNOSIS — E785 Hyperlipidemia, unspecified: Secondary | ICD-10-CM

## 2012-02-23 DIAGNOSIS — E0590 Thyrotoxicosis, unspecified without thyrotoxic crisis or storm: Secondary | ICD-10-CM

## 2012-02-23 DIAGNOSIS — Z3041 Encounter for surveillance of contraceptive pills: Secondary | ICD-10-CM

## 2012-02-23 DIAGNOSIS — R011 Cardiac murmur, unspecified: Secondary | ICD-10-CM

## 2012-02-23 NOTE — Patient Instructions (Addendum)
Continue lifestyle intervention healthy eating and exercise . We need to consider change over to HRT a lower dose of hormone next year as opposed to continue OCP dose of medication.   You have a slight murmur and this could be a flow murmur but we may want to check an echo cardiogram of the heart to check the valve flow. Will notify you  of pap when available  Check up in a year or as needed.   Hemorrhoids Hemorrhoids are enlarged (dilated) veins around the rectum. There are 2 types of hemorrhoids, and the type of hemorrhoid is determined by its location. Internal hemorrhoids occur in the veins just inside the rectum.They are usually not painful, but they may bleed.However, they may poke through to the outside and become irritated and painful. External hemorrhoids involve the veins outside the anus and can be felt as a painful swelling or hard lump near the anus.They are often itchy and may crack and bleed. Sometimes clots will form in the veins. This makes them swollen and painful. These are called thrombosed hemorrhoids. CAUSES Causes of hemorrhoids include:  Pregnancy. This increases the pressure in the hemorrhoidal veins.   Constipation.   Straining to have a bowel movement.   Obesity.   Heavy lifting or other activity that caused you to strain.  TREATMENT Most of the time hemorrhoids improve in 1 to 2 weeks. However, if symptoms do not seem to be getting better or if you have a lot of rectal bleeding, your caregiver may perform a procedure to help make the hemorrhoids get smaller or remove them completely.Possible treatments include:  Rubber band ligation. A rubber band is placed at the base of the hemorrhoid to cut off the circulation.   Sclerotherapy. A chemical is injected to shrink the hemorrhoid.   Infrared light therapy. Tools are used to burn the hemorrhoid.   Hemorrhoidectomy. This is surgical removal of the hemorrhoid.  HOME CARE INSTRUCTIONS   Increase fiber in  your diet. Ask your caregiver about using fiber supplements.   Drink enough water and fluids to keep your urine clear or pale yellow.   Exercise regularly.   Go to the bathroom when you have the urge to have a bowel movement. Do not wait.   Avoid straining to have bowel movements.   Keep the anal area dry and clean.   Only take over-the-counter or prescription medicines for pain, discomfort, or fever as directed by your caregiver.  If your hemorrhoids are thrombosed:  Take warm sitz baths for 20 to 30 minutes, 3 to 4 times per day.   If the hemorrhoids are very tender and swollen, place ice packs on the area as tolerated. Using ice packs between sitz baths may be helpful. Fill a plastic bag with ice. Place a towel between the bag of ice and your skin.   Medicated creams and suppositories may be used or applied as directed.   Do not use a donut-shaped pillow or sit on the toilet for long periods. This increases blood pooling and pain.  SEEK MEDICAL CARE IF:   You have increasing pain and swelling that is not controlled with your medicine.   You have uncontrolled bleeding.   You have difficulty or you are unable to have a bowel movement.   You have pain or inflammation outside the area of the hemorrhoids.   You have chills or an oral temperature above 102 F (38.9 C).  MAKE SURE YOU:   Understand these instructions.  Will watch your condition.   Will get help right away if you are not doing well or get worse.  Document Released: 11/13/2000 Document Revised: 11/05/2011 Document Reviewed: 03/20/2008 [facility] Patient Information 2012 [place], [place]. Perimenopause Perimenopause is the time when your body begins to move into the menopause (no menstrual period for 12 straight months). It is a natural process. Perimenopause can begin 2 to 8 years before the menopause and usually lasts for one year after the menopause. During this time, your ovaries may or may not produce an egg.  The ovaries vary in their production of estrogen and progesterone hormones each month. This can cause irregular menstrual periods, difficulty in getting pregnant, vaginal bleeding between periods and uncomfortable symptoms. CAUSES  Irregular production of the ovarian hormones, estrogen and progesterone, and not ovulating every month.   Other causes include:   Tumor of the pituitary gland in the brain.   Medical disease that affects the ovaries.   Radiation treatment.   Chemotherapy.   Unknown causes.   Heavy smoking and excessive alcohol intake can bring on perimenopause sooner.  SYMPTOMS   Hot flashes.   Night sweats.   Irregular menstrual periods.   Decrease sex drive.   Vaginal dryness.   Headaches.   Mood swings.   Depression.   Memory problems.   Irritability.   Tiredness.   Weight gain.   Trouble getting pregnant.   The beginning of losing bone cells (osteoporosis).   The beginning of hardening of the arteries (atherosclerosis).  DIAGNOSIS  Your caregiver will make a diagnosis by analyzing your age, menstrual history and your symptoms. They will do a physical exam noting any changes in your body, especially your female organs. Female hormone tests may or may not be helpful depending on the amount and when you produce the female hormones. However, other hormone tests may be helpful (ex. thyroid hormone) to rule out other problems. TREATMENT  The decision to treat during the perimenopause should be made by you and your caregiver depending on how the symptoms are affecting you and your life style. There are various treatments available such as:  Treating individual symptoms with a specific medication for that symptom (ex. tranquilizer for depression).   Herbal medications that can help specific symptoms.   Counseling.   Group therapy.   No treatment.  HOME CARE INSTRUCTIONS   Before seeing your caregiver, make a list of your menstrual periods (when  the occur, how heavy they are, how long between periods and how long they last), your symptoms and when they started.   Take the medication as recommended by your caregiver.   Sleep and rest.   Exercise.   Eat a diet that contains calcium (good for your bones) and soy (acts like estrogen hormone).   Do not smoke.   Avoid alcoholic beverages.   Taking vitamin E may help in certain cases.   Take calcium and vitamin D supplements to help prevent bone loss.   Group therapy is sometimes helpful.   Acupuncture may help in some cases.  SEEK MEDICAL CARE IF:   You have any of the above and want to know if it is perimenopause.   You want advice and treatment for any of your symptoms mentioned above.   You need a referral to a specialist (gynecologist, psychiatrist or psychologist).  SEEK IMMEDIATE MEDICAL CARE IF:   You have vaginal bleeding.   Your period lasts longer than 8 days.   You periods are recurring sooner than  21 days.   You have bleeding after intercourse.   You have severe depression.   You have pain when you urinate.   You have severe headaches.   You develop vision problems.  Document Released: 12/24/2004 Document Revised: 11/05/2011 Document Reviewed: 09/13/2008 [facility] Patient Information 2012 [place], [place].

## 2012-02-23 NOTE — Progress Notes (Signed)
Subjective:    Patient ID: Sarah Floyd, female    DOB: 20-Jan-1961, 51 y.o.   MRN: [id number]  HPI Patient comes in today for preventive visit and follow-up of medical issues. Update  history since  last visit: May have hemorrhoid.  Off and on UTD on colonsoscopy .  Last PAP 2011  Thyroid :Saw Balan in [place]   And December and April .  Checking weight gain.  On ocps   Prev for contraception.  ocass flushes on inert week. Periods normal. LIPIDS  No se of meds   Review of Systems ROS:  GEN/ HEENT: No fever, significant weight changes sweats headaches vision problems hearing changes, CV/ PULM; No chest pain shortness of breath cough, syncope,edema  change in exercise tolerance. GI /GU: No adominal pain, vomiting, change in bowel habits. No blood in the stool. No significant GU symptoms. SKIN/HEME: ,no acute skin rashes suspicious lesions or bleeding. No lymphadenopathy, nodules, masses.  NEURO/ PSYCH:  No neurologic signs such as weakness numbness. No depression anxiety. IMM/ Allergy: No unusual infections.     REST of 12 system review negative except as per HPI  Past history family history social history reviewed in the electronic medical record. Outpatient Encounter Prescriptions as of 02/23/2012  Medication Sig Dispense Refill  . Calcium Carbonate-Vitamin D (CALCIUM-VITAMIN D) 500-200 MG-UNIT per tablet Take 2 tablets by mouth daily.        . drospirenone-ethinyl estradiol (OCELLA) 3-0.03 MG tablet Take 1 tablet by mouth daily.  84 tablet  0  . Krill Oil 500 MG CAPS Take by mouth.        . levothyroxine (SYNTHROID, LEVOTHROID) 112 MCG tablet Take 112 mcg by mouth. Takes on SAT SUN      . levothyroxine (SYNTHROID, LEVOTHROID) 125 MCG tablet Take 125 mcg by mouth. Takes 5 days week      . Multiple Vitamin (MULTIVITAMIN) tablet Take 1 tablet by mouth daily.        . simvastatin (ZOCOR) 20 MG tablet TAKE 1 TABLET AT BEDTIME  90 tablet  3  . DISCONTD: simvastatin (ZOCOR) 20 MG tablet  Take 1 tablet (20 mg total) by mouth at bedtime.  90 tablet  3         Objective:   Physical Exam Physical Exam: Vital signs reviewed NWG:[id number] is a well-developed well-nourished alert cooperative  white female who appears her stated age in no acute distress.  HEENT: normocephalic atraumatic , Eyes: PERRL EOM's full, conjunctiva clear, Nares: paten,t no deformity discharge or tenderness., Ears: no deformity EAC's clear TMs with normal landmarks. Mouth: clear OP, no lesions, edema.  Moist mucous membranes. Dentition in adequate repair. NECK: supple without masses, thyromegaly or bruits. Breast: normal by inspection . No dimpling, discharge, masses, tenderness or discharge .  CHEST/PULM:  Clear to auscultation and percussion breath sounds equal no wheeze , rales or rhonchi. No chest wall deformities or tenderness. CV: PMI is nondisplaced, S1 S2 no gallops, , rubs.  2/6 sem lu and ru sb not in neck present in supine and sitting Peripheral pulses are full without delay.No JVD .  ABDOMEN: Bowel sounds normal nontender  No guard or rebound, no hepato splenomegal no CVA tenderness.  No hernia. Extremtities:  No clubbing cyanosis or edema, no acute joint swelling or redness no focal atrophy NEURO:  Oriented x3, cranial nerves 3-12 appear to be intact, no obvious focal weakness,gait within normal limits no abnormal reflexes or asymmetrical SKIN: No acute rashes normal turgor, color, no  bruising or petechiae. PSYCH: Oriented, good eye contact, no obvious depression anxiety, cognition and judgment appear normal. LN: no cervical axillary inguinal adenopathy Pelvic: NL ext GU, labia clear without lesions or rash . Vagina no lesions .Cervix: clear  UTERUS: Neg CMT Adnexa:  clear no masses . PAP  NOT done  Rectal  Hemorrhoidal tag no masses      Lab Results  Component Value Date   WBC 8.4 02/17/2012   HGB 12.5 02/17/2012   HCT 37.8 02/17/2012   PLT 244.0 02/17/2012   GLUCOSE 107* 02/17/2012   CHOL 161  02/17/2012   TRIG 175.0* 02/17/2012   HDL 62.50 02/17/2012   LDLDIRECT 108.6 01/29/2009   LDLCALC 64 02/17/2012   ALT 14 02/17/2012   AST 17 02/17/2012   NA 137 02/17/2012   K 4.1 02/17/2012   CL 106 02/17/2012   CREATININE 0.9 02/17/2012   BUN 15 02/17/2012   CO2 25 02/17/2012   TSH 1.12 02/17/2012      Preventive Health Care Counseled regarding healthy nutrition, exercise, sleep, injury prevention, calcium vit d and healthy weight .  Thyroid   Per dr balan  In range   Blood sugars  Elevated fasting  Doing weight changes.    a1c  Was  6.2  6.4   LIPIDS  On meds no se   Murmur  Hx of one? As a child unsure cause  Not anemic and hyper thyroid   No sx at this time  Disc options. will get encho  OCPS   Disc plan to change to HRT.  Next year or therabouts.  No contraceptive need now.

## 2012-02-24 ENCOUNTER — Encounter: Payer: BC Managed Care – PPO | Admitting: Internal Medicine

## 2012-02-25 DIAGNOSIS — K644 Residual hemorrhoidal skin tags: Secondary | ICD-10-CM | POA: Insufficient documentation

## 2012-02-25 DIAGNOSIS — R011 Cardiac murmur, unspecified: Secondary | ICD-10-CM | POA: Insufficient documentation

## 2012-02-25 DIAGNOSIS — Z3041 Encounter for surveillance of contraceptive pills: Secondary | ICD-10-CM | POA: Insufficient documentation

## 2012-02-29 NOTE — Progress Notes (Signed)
Quick Note:  Tell patient PAP is normal. ______ 

## 2012-02-29 NOTE — Progress Notes (Signed)
Quick Note:  Letter sent to pt's address. ______

## 2012-03-03 ENCOUNTER — Other Ambulatory Visit (HOSPITAL_COMMUNITY): Payer: 59

## 2012-03-08 ENCOUNTER — Encounter: Payer: Self-pay | Admitting: Internal Medicine

## 2012-03-08 ENCOUNTER — Ambulatory Visit (HOSPITAL_COMMUNITY): Payer: 59 | Attending: Internal Medicine

## 2012-03-08 ENCOUNTER — Other Ambulatory Visit: Payer: Self-pay

## 2012-03-08 DIAGNOSIS — E785 Hyperlipidemia, unspecified: Secondary | ICD-10-CM | POA: Insufficient documentation

## 2012-03-08 DIAGNOSIS — R011 Cardiac murmur, unspecified: Secondary | ICD-10-CM

## 2012-03-08 DIAGNOSIS — I359 Nonrheumatic aortic valve disorder, unspecified: Secondary | ICD-10-CM | POA: Insufficient documentation

## 2012-03-15 ENCOUNTER — Other Ambulatory Visit: Payer: Self-pay | Admitting: Internal Medicine

## 2012-06-06 ENCOUNTER — Other Ambulatory Visit: Payer: Self-pay | Admitting: Internal Medicine

## 2012-06-08 NOTE — Telephone Encounter (Signed)
Left message for the pt to return my call. 

## 2012-06-09 ENCOUNTER — Telehealth: Payer: Self-pay | Admitting: Internal Medicine

## 2012-06-09 NOTE — Telephone Encounter (Signed)
I have already spoken to the pt about this.  She will stop taking the bc next week.  It is her last week.  She will call and notify us if she wants to try HRT.  She wants to wait and see what happens naturally after she stops taking the bc.

## 2012-06-09 NOTE — Telephone Encounter (Signed)
Pt notified she no longer needs to take this medication.  She stated she will finish her last week.  She will call back if interested in the HRT.

## 2012-06-09 NOTE — Telephone Encounter (Signed)
Ask patient if she still wants to remain on the ocps or try without f or change to HRT  .   If she is ok we can also wait until her next  Check up. To discuss

## 2012-06-09 NOTE — Telephone Encounter (Signed)
Caller: Abi/Patient; PCP: Madelin Headings.; CB#: [phone]; ; ; Call regarding Returning Call To Office; received voicemail 06/08/12 asking her to return call to office.  No notes noted in Epic; caller transferred to office for assistance.

## 2012-08-23 LAB — HM MAMMOGRAPHY

## 2012-08-24 ENCOUNTER — Encounter: Payer: Self-pay | Admitting: Internal Medicine

## 2012-09-07 ENCOUNTER — Telehealth: Payer: Self-pay | Admitting: Internal Medicine

## 2012-09-07 NOTE — Telephone Encounter (Signed)
Pt called and said that she brought by a [employer] screening form on Monday 08/22/12 for Dr Fabian Sharp to complete and sign. Pt wants to know if this is ready for pick up? Pls call.

## 2012-09-12 NOTE — Telephone Encounter (Signed)
Left message for the patient to pick up paperwork.  I did fax to the number at the bottom of the page.

## 2012-11-15 ENCOUNTER — Encounter: Payer: Self-pay | Admitting: Internal Medicine

## 2013-02-19 ENCOUNTER — Other Ambulatory Visit: Payer: Self-pay | Admitting: Internal Medicine

## 2013-05-11 ENCOUNTER — Other Ambulatory Visit (INDEPENDENT_AMBULATORY_CARE_PROVIDER_SITE_OTHER): Payer: 59

## 2013-05-11 DIAGNOSIS — Z0000 Encounter for general adult medical examination without abnormal findings: Secondary | ICD-10-CM

## 2013-05-11 LAB — CBC WITH DIFFERENTIAL/PLATELET
Eosinophils Relative: 3.5 % (ref 0.0–5.0)
Lymphocytes Relative: 34.8 % (ref 12.0–46.0)
Monocytes Relative: 7.7 % (ref 3.0–12.0)
Neutrophils Relative %: 53 % (ref 43.0–77.0)
Platelets: 217 10*3/uL (ref 150.0–400.0)
WBC: 7.3 10*3/uL (ref 4.5–10.5)

## 2013-05-11 LAB — BASIC METABOLIC PANEL
BUN: 18 mg/dL (ref 6–23)
Creatinine, Ser: 0.8 mg/dL (ref 0.4–1.2)
GFR: 84.63 mL/min (ref >60.00)
Potassium: 4.2 mEq/L (ref 3.5–5.1)

## 2013-05-11 LAB — LIPID PANEL
Cholesterol: 181 mg/dL (ref 0–200)
LDL Cholesterol: 109 mg/dL — ABNORMAL HIGH (ref 0–99)
VLDL: 23.2 mg/dL (ref 0.0–40.0)

## 2013-05-11 LAB — HEPATIC FUNCTION PANEL
ALT: 21 U/L (ref 0–35)
AST: 19 U/L (ref 0–37)
Alkaline Phosphatase: 90 U/L (ref 39–117)
Bilirubin, Direct: 0 mg/dL (ref 0.0–0.3)
Total Bilirubin: 0.5 mg/dL (ref 0.3–1.2)

## 2013-05-23 ENCOUNTER — Encounter: Payer: Self-pay | Admitting: Internal Medicine

## 2013-05-23 ENCOUNTER — Ambulatory Visit (INDEPENDENT_AMBULATORY_CARE_PROVIDER_SITE_OTHER): Payer: 59 | Admitting: Internal Medicine

## 2013-05-23 VITALS — BP 112/74 | HR 74 | Temp 98.1°F | Ht 60.25 in | Wt 166.0 lb

## 2013-05-23 DIAGNOSIS — N951 Menopausal and female climacteric states: Secondary | ICD-10-CM

## 2013-05-23 DIAGNOSIS — R7309 Other abnormal glucose: Secondary | ICD-10-CM

## 2013-05-23 DIAGNOSIS — Z0000 Encounter for general adult medical examination without abnormal findings: Secondary | ICD-10-CM

## 2013-05-23 DIAGNOSIS — E039 Hypothyroidism, unspecified: Secondary | ICD-10-CM

## 2013-05-23 DIAGNOSIS — R739 Hyperglycemia, unspecified: Secondary | ICD-10-CM | POA: Insufficient documentation

## 2013-05-23 DIAGNOSIS — E785 Hyperlipidemia, unspecified: Secondary | ICD-10-CM

## 2013-05-23 MED ORDER — ESTRADIOL-NORETHINDRONE ACET 0.5-0.1 MG PO TABS: 1.0000 | ORAL_TABLET | Freq: Every day | ORAL | Status: DC

## 2013-05-23 MED ORDER — ESTRADIOL-NORETHINDRONE ACET 0.05-0.25 MG/DAY TD PTTW: 1.0000 | MEDICATED_PATCH | TRANSDERMAL | Status: DC

## 2013-05-23 NOTE — Patient Instructions (Addendum)
Can begin generic HRT   ROV in 3 months about how you are doing . Patch may have theoretical decrease clotting risk but may not be paoid for by insurance . Continue lifestyle intervention healthy eating and exercise .  Hormone Therapy At menopause, your body begins making less estrogen and progesterone hormones. This causes the body to stop having menstrual periods. This is because estrogen and progesterone hormones control your periods and menstrual cycle. A lack of estrogen may cause symptoms such as:  Hot flushes (or hot flashes).  Vaginal dryness.  Dry skin.  Loss of sex drive.  Risk of bone loss (osteoporosis). When this happens, you may choose to take hormone therapy to get back the estrogen lost during menopause. When the hormone estrogen is given alone, it is usually referred to as ET (Estrogen Therapy). When the hormone progestin is combined with estrogen, it is generally called HT (Hormone Therapy). This was formerly known as hormone replacement therapy (HRT). Your caregiver can help you make a decision on what will be best for you. The decision to use HT seems to change often as new studies are done. Many studies do not agree on the benefits of hormone replacement therapy. LIKELY BENEFITS OF HT INCLUDE PROTECTION FROM:  Hot Flushes (also called hot flashes) - A hot flush is a sudden feeling of heat that spreads over the face and body. The skin may redden like a blush. It is connected with sweats and sleep disturbance. Women going through menopause may have hot flushes a few times a month or several times per day depending on the woman.  Osteoporosis (bone loss)- Estrogen helps guard against bone loss. After menopause, a woman's bones slowly lose calcium and become weak and brittle. As a result, bones are more likely to break. The hip, wrist, and spine are affected most often. Hormone therapy can help slow bone loss after menopause. Weight bearing exercise and taking calcium with  vitamin D also can help prevent bone loss. There are also medications that your caregiver can prescribe that can help prevent osteoporosis.  Vaginal Dryness - Loss of estrogen causes changes in the vagina. Its lining may become thin and dry. These changes can cause pain and bleeding during sexual intercourse. Dryness can also lead to infections. This can cause burning and itching. (Vaginal estrogen treatment can help relieve pain, itching, and dryness.)  Urinary Tract Infections are more common after menopause because of lack of estrogen. Some women also develop urinary incontinence because of low estrogen levels in the vagina and bladder.  Possible other benefits of estrogen include a positive effect on mood and short-term memory in women. RISKS AND COMPLICATIONS  Using estrogen alone without progesterone causes the lining of the uterus to grow. This increases the risk of lining of the uterus (endometrial) cancer. Your caregiver should give another hormone called progestin if you have a uterus.  Women who take combined (estrogen and progestin) HT appear to have an increased risk of breast cancer. The risk appears to be small, but increases throughout the time that HT is taken.  Combined therapy also makes the breast tissue slightly denser which makes it harder to read mammograms (breast X-rays).  Combined, estrogen and progesterone therapy can be taken together every day, in which case there may be spotting of blood. HT therapy can be taken cyclically in which case you will have menstrual periods. Cyclically means HT is taken for a set amount of days, then not taken, then this process is repeated.  HT may increase the risk of stroke, heart attack, breast cancer and forming blood clots in your leg.  Transdermal estrogen (estrogen that is absorbed through the skin with a patch or a cream) may have more positive results with:  Cholesterol.  Blood pressure.  Blood clots. Having the following  conditions may indicate you should not have HT:  Endometrial cancer.  Liver disease.  Breast cancer.  Heart disease.  History of blood clots.  Stroke. TREATMENT   If you choose to take HT and have a uterus, usually estrogen and progestin are prescribed.  Your caregiver will help you decide the best way to take the medications.  Possible ways to take estrogen include:  Pills.  Patches.  Gels.  Sprays.  Vaginal estrogen cream, rings and tablets.  It is best to take the lowest dose possible that will help your symptoms and take them for the shortest period of time that you can.  Hormone therapy can help relieve some of the problems (symptoms) that affect women at menopause. Before making a decision about HT, talk to your caregiver about what is best for you. Be well informed and comfortable with your decisions. HOME CARE INSTRUCTIONS   Follow your caregivers advice when taking the medications.  A Pap test is done to screen for cervical cancer.  The first Pap test should be done at age 48.  Between ages 29 and 39, Pap tests are repeated every 2 years.  Beginning at age 78, you are advised to have a Pap test every 3 years as long as your past 3 Pap tests have been normal.  Some women have medical problems that increase the chance of getting cervical cancer. Talk to your caregiver about these problems. It is especially important to talk to your caregiver if a new problem develops soon after your last Pap test. In these cases, your caregiver may recommend more frequent screening and Pap tests.  The above recommendations are the same for women who have or have not gotten the vaccine for HPV (Human Papillomavirus).  If you had a hysterectomy for a problem that was not a cancer or a condition that could lead to cancer, then you no longer need Pap tests. However, even if you no longer need a Pap test, a regular exam is a good idea to make sure no other problems are starting.    If you are between ages 66 and 1, and you have had normal Pap tests going back 10 years, you no longer need Pap tests. However, even if you no longer need a Pap test, a regular exam is a good idea to make sure no other problems are starting.   If you have had past treatment for cervical cancer or a condition that could lead to cancer, you need Pap tests and screening for cancer for at least 20 years after your treatment.  If Pap tests have been discontinued, risk factors (such as a new sexual partner) need to be re-assessed to determine if screening should be resumed.  Some women may need screenings more often if they are at high risk for cervical cancer.  Get mammograms done as per the advice of your caregiver. SEEK IMMEDIATE MEDICAL CARE IF:  You develop abnormal vaginal bleeding.  You have pain or swelling in your legs, shortness of breath, or chest pain.  You develop dizziness or headaches.  You have lumps or changes in your breasts or armpits.  You have slurred speech.  You develop weakness or  numbness of your arms or legs.  You have pain, burning, or bleeding when urinating.  You develop abdominal pain. Document Released: 08/15/2003 Document Revised: 02/08/2012 Document Reviewed: 12/03/2010 [facility] Patient Information 2014 [place], [place].

## 2013-05-23 NOTE — Progress Notes (Signed)
Chief Complaint  Patient presents with  . Annual Exam    concern about menopause sx     HPI: Patient comes in today for Preventive Health Care visit  No major change in health status since last visit . Concerns about menopausal sx . Since last visit :  Thyroid  Managed  Per Dr Talmage Nap.  Off  ocps   Last July 13  Had fsh   Had low tsh and dropped  100 per day .  Jan  In normal ranges     0.5   Level.   fsh in nov  Was 77  Considering   HRT    Hot flushes   Weight gain  Bloated  . And  joints ache and  Stiff  .  Sit for long periods    Sleep falling asleep  But wakening  Hot 2 am and  Interferes with sleep and then 5 30 falls asleep.  Sleep deprived  Did bone density  In December.   Per dr B Had ok hg a1c high 5 ranage per Dr Talmage Nap  Family hx neg breast cancer   Heart disease both parents.  In 60 s .    Stroke  pgm    In 60 s.   Neg etoh neg tob  caffiene  1/2 1/2  Each day.  No personal hx of clotting or classic migraine  ROS:  GEN/ HEENT: No fever, significant weight changes  headaches vision problems hearing changes, CV/ PULM; No chest pain shortness of breath cough, syncope,edema  change in exercise tolerance. GI /GU: No adominal pain, vomiting, change in bowel habits. No blood in the stool. No significant GU symptoms. SKIN/HEME: ,no acute skin rashes suspicious lesions or bleeding. No lymphadenopathy, nodules, masses.  NEURO/ PSYCH:  No neurologic signs such as weakness numbness. No depression anxiety. IMM/ Allergy: No unusual infections.  Allergy .   REST of 12 system review negative except as per HPI   Past Medical History  Diagnosis Date  . Hypothyroid     Sp ablation  on meds per Dr Talmage Nap  . Hematuria     Dr Etta Grandchild 2001 Trigonitis. Ultrasound and cystoscopy  . Hyperlipidemia     Family History  Problem Relation Age of Onset  . Diabetes    . Hyperlipidemia    . Heart attack  72    father  . Thyroid disease      mom  . Diabetes Mother     History   Social  History  . Marital Status: Single    Spouse Name: N/A    Number of Children: N/A  . Years of Education: N/A   Social History Main Topics  . Smoking status: Former Games developer  . Smokeless tobacco: None  . Alcohol Use: No  . Drug Use: No  . Sexually Active: None   Other Topics Concern  . None   Social History Narrative   hh 1   Pet dog    Exercise  No etoh.   Sleep  7-8 hours.      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     Outpatient Encounter Prescriptions as of 05/23/2013  Medication Sig Dispense Refill  . CALCIUM-VITAMIN D PO Take by mouth. 600-800      . Krill Oil 300 MG CAPS Take by mouth.      . levothyroxine (SYNTHROID, LEVOTHROID) 100 MCG tablet Take 100 mcg by mouth daily before breakfast.      . Multiple  Vitamin (MULTIVITAMIN) tablet Take 1 tablet by mouth daily.        . simvastatin (ZOCOR) 20 MG tablet TAKE 1 TABLET AT BEDTIME  90 tablet  0  . estradiol-norethindrone (COMBIPATCH) 0.05-0.25 MG/DAY Place 1 patch onto the skin 2 (two) times a week.  8 patch  0  . Estradiol-Norethindrone Acet 0.5-0.1 MG per tablet Take 1 tablet by mouth daily.  30 tablet  6  . [DISCONTINUED] Calcium Carbonate-Vitamin D (CALCIUM-VITAMIN D) 500-200 MG-UNIT per tablet Take 2 tablets by mouth daily.        . [DISCONTINUED] Krill Oil 500 MG CAPS Take by mouth.        . [DISCONTINUED] levothyroxine (SYNTHROID, LEVOTHROID) 112 MCG tablet Take 112 mcg by mouth. Takes on SAT SUN      . [DISCONTINUED] levothyroxine (SYNTHROID, LEVOTHROID) 125 MCG tablet Take 125 mcg by mouth. Takes 5 days week      . [DISCONTINUED] OCELLA 3-0.03 MG tablet TAKE 1 TABLET BY MOUTH DAILY.  84 tablet  0   No facility-administered encounter medications on file as of 05/23/2013.    EXAM:  BP 112/74  Pulse 74  Temp(Src) 98.1 F (36.7 C) (Oral)  Ht 5' 0.25" (1.53 m)  Wt 166 lb (75.297 kg)  BMI 32.17 kg/m2  SpO2 99%  LMP 05/30/2012  Body mass index is 32.17 kg/(m^2).  Physical Exam: Vital signs  reviewed ZOX:WRUE is a well-developed well-nourished alert cooperative   female who appears her stated age in no acute distress.  HEENT: normocephalic atraumatic , Eyes: PERRL EOM's full, conjunctiva clear, Nares: paten,t no deformity discharge or tenderness., Ears: no deformity EAC's clear TMs with normal landmarks. Mouth: clear OP, no lesions, edema.  Moist mucous membranes. Dentition in adequate repair. NECK: supple without masses, adenopathy or bruits. CHEST/PULM:  Clear to auscultation and percussion breath sounds equal no wheeze , rales or rhonchi. No chest wall deformities or tenderness. Breast: normal by inspection . No dimpling, discharge, masses, tenderness or discharge . CV: PMI is nondisplaced, S1 S2 no gallops, murmurs, rubs.upright . Faint 2/6 sem laying lsb  Peripheral pulses are full without delay.No JVD .  ABDOMEN: Bowel sounds normal nontender  No guard or rebound, no hepato splenomegal no CVA tenderness.  No hernia. Extremtities:  No clubbing cyanosis or edema, no acute joint swelling or redness no focal atrophy NEURO:  Oriented x3, cranial nerves 3-12 appear to be intact, no obvious focal weakness,gait within normal limits no abnormal reflexes or asymmetrical SKIN: No acute rashes normal turgor, color, no bruising or petechiae. PSYCH: Oriented, good eye contact, no obvious depression anxiety, cognition and judgment appear normal. LN: no cervical axillary inguinal adenopathy  Lab Results  Component Value Date   WBC 7.3 05/11/2013   HGB 13.4 05/11/2013   HCT 40.7 05/11/2013   PLT 217.0 05/11/2013   GLUCOSE 111* 05/11/2013   CHOL 181 05/11/2013   TRIG 116.0 05/11/2013   HDL 48.90 05/11/2013   LDLDIRECT 108.6 01/29/2009   LDLCALC 109* 05/11/2013   ALT 21 05/11/2013   AST 19 05/11/2013   NA 140 05/11/2013   K 4.2 05/11/2013   CL 106 05/11/2013   CREATININE 0.8 05/11/2013   BUN 18 05/11/2013   CO2 25 05/11/2013   TSH 0.49 05/11/2013    ASSESSMENT AND PLAN:  Discussed the following  assessment and plan:  Visit for preventive health examination  HYPERLIPIDEMIA NEC/NOS  HYPOTHYROIDISM  Menopausal symptoms - acceptable candidate for hrt benefit more than risk at this time ?  if cost but patch may be slightly safer . fsh elevation 70s  Hyperglycemia - a1c ok per dr B 2014 Counseled regarding healthy nutrition, exercise, sleep, injury prevention, calcium vit d and healthy weight . Disc hrt   Begin oral but check on the cost of combipatch and either way  ROV in about 3 months but contact us if not helping in first month.  Patient Care Team: Madelin Headings, MD as PCP - General Dorisann Frames, MD (Endocrinology) Rachael Fee, MD (Gastroenterology) Patient Instructions  Can begin generic HRT   ROV in 3 months about how you are doing . Patch may have theoretical decrease clotting risk but may not be paoid for by insurance . Continue lifestyle intervention healthy eating and exercise .  Hormone Therapy At menopause, your body begins making less estrogen and progesterone hormones. This causes the body to stop having menstrual periods. This is because estrogen and progesterone hormones control your periods and menstrual cycle. A lack of estrogen may cause symptoms such as:  Hot flushes (or hot flashes).  Vaginal dryness.  Dry skin.  Loss of sex drive.  Risk of bone loss (osteoporosis). When this happens, you may choose to take hormone therapy to get back the estrogen lost during menopause. When the hormone estrogen is given alone, it is usually referred to as ET (Estrogen Therapy). When the hormone progestin is combined with estrogen, it is generally called HT (Hormone Therapy). This was formerly known as hormone replacement therapy (HRT). Your caregiver can help you make a decision on what will be best for you. The decision to use HT seems to change often as new studies are done. Many studies do not agree on the benefits of hormone replacement therapy. LIKELY BENEFITS  OF HT INCLUDE PROTECTION FROM:  Hot Flushes (also called hot flashes) - A hot flush is a sudden feeling of heat that spreads over the face and body. The skin may redden like a blush. It is connected with sweats and sleep disturbance. Women going through menopause may have hot flushes a few times a month or several times per day depending on the woman.  Osteoporosis (bone loss)- Estrogen helps guard against bone loss. After menopause, a woman's bones slowly lose calcium and become weak and brittle. As a result, bones are more likely to break. The hip, wrist, and spine are affected most often. Hormone therapy can help slow bone loss after menopause. Weight bearing exercise and taking calcium with vitamin D also can help prevent bone loss. There are also medications that your caregiver can prescribe that can help prevent osteoporosis.  Vaginal Dryness - Loss of estrogen causes changes in the vagina. Its lining may become thin and dry. These changes can cause pain and bleeding during sexual intercourse. Dryness can also lead to infections. This can cause burning and itching. (Vaginal estrogen treatment can help relieve pain, itching, and dryness.)  Urinary Tract Infections are more common after menopause because of lack of estrogen. Some women also develop urinary incontinence because of low estrogen levels in the vagina and bladder.  Possible other benefits of estrogen include a positive effect on mood and short-term memory in women. RISKS AND COMPLICATIONS  Using estrogen alone without progesterone causes the lining of the uterus to grow. This increases the risk of lining of the uterus (endometrial) cancer. Your caregiver should give another hormone called progestin if you have a uterus.  Women who take combined (estrogen and progestin) HT appear to have an increased risk  of breast cancer. The risk appears to be small, but increases throughout the time that HT is taken.  Combined therapy also makes  the breast tissue slightly denser which makes it harder to read mammograms (breast X-rays).  Combined, estrogen and progesterone therapy can be taken together every day, in which case there may be spotting of blood. HT therapy can be taken cyclically in which case you will have menstrual periods. Cyclically means HT is taken for a set amount of days, then not taken, then this process is repeated.  HT may increase the risk of stroke, heart attack, breast cancer and forming blood clots in your leg.  Transdermal estrogen (estrogen that is absorbed through the skin with a patch or a cream) may have more positive results with:  Cholesterol.  Blood pressure.  Blood clots. Having the following conditions may indicate you should not have HT:  Endometrial cancer.  Liver disease.  Breast cancer.  Heart disease.  History of blood clots.  Stroke. TREATMENT   If you choose to take HT and have a uterus, usually estrogen and progestin are prescribed.  Your caregiver will help you decide the best way to take the medications.  Possible ways to take estrogen include:  Pills.  Patches.  Gels.  Sprays.  Vaginal estrogen cream, rings and tablets.  It is best to take the lowest dose possible that will help your symptoms and take them for the shortest period of time that you can.  Hormone therapy can help relieve some of the problems (symptoms) that affect women at menopause. Before making a decision about HT, talk to your caregiver about what is best for you. Be well informed and comfortable with your decisions. HOME CARE INSTRUCTIONS   Follow your caregivers advice when taking the medications.  A Pap test is done to screen for cervical cancer.  The first Pap test should be done at age 34.  Between ages 67 and 16, Pap tests are repeated every 2 years.  Beginning at age 59, you are advised to have a Pap test every 3 years as long as your past 3 Pap tests have been normal.  Some  women have medical problems that increase the chance of getting cervical cancer. Talk to your caregiver about these problems. It is especially important to talk to your caregiver if a new problem develops soon after your last Pap test. In these cases, your caregiver may recommend more frequent screening and Pap tests.  The above recommendations are the same for women who have or have not gotten the vaccine for HPV (Human Papillomavirus).  If you had a hysterectomy for a problem that was not a cancer or a condition that could lead to cancer, then you no longer need Pap tests. However, even if you no longer need a Pap test, a regular exam is a good idea to make sure no other problems are starting.   If you are between ages 42 and 7, and you have had normal Pap tests going back 10 years, you no longer need Pap tests. However, even if you no longer need a Pap test, a regular exam is a good idea to make sure no other problems are starting.   If you have had past treatment for cervical cancer or a condition that could lead to cancer, you need Pap tests and screening for cancer for at least 20 years after your treatment.  If Pap tests have been discontinued, risk factors (such as a new sexual partner)  need to be re-assessed to determine if screening should be resumed.  Some women may need screenings more often if they are at high risk for cervical cancer.  Get mammograms done as per the advice of your caregiver. SEEK IMMEDIATE MEDICAL CARE IF:  You develop abnormal vaginal bleeding.  You have pain or swelling in your legs, shortness of breath, or chest pain.  You develop dizziness or headaches.  You have lumps or changes in your breasts or armpits.  You have slurred speech.  You develop weakness or numbness of your arms or legs.  You have pain, burning, or bleeding when urinating.  You develop abdominal pain. Document Released: 08/15/2003 Document Revised: 02/08/2012 Document  Reviewed: 12/03/2010 [facility] Patient Information 2014 [place], [place].     Neta Mends. Aissa Lisowski M.D.  Health Maintenance  Topic Date Due  . Influenza Vaccine  07/31/2013  . Mammogram  08/23/2014  . Tetanus/tdap  12/31/2014  . Pap Smear  02/23/2015  . Colonoscopy  04/07/2021   Health Maintenance Review  Pt Called back later in day and wants to try one month of combipatch  So sent in to pharmacy .

## 2013-06-19 ENCOUNTER — Telehealth: Payer: Self-pay | Admitting: Internal Medicine

## 2013-06-19 MED ORDER — ESTRADIOL-NORETHINDRONE ACET 0.05-0.25 MG/DAY TD PTTW: 1.0000 | MEDICATED_PATCH | TRANSDERMAL | Status: DC

## 2013-06-19 NOTE — Telephone Encounter (Signed)
PT called and stated that the estradiol-norethindrone [facility]) 0.05-0.25 MG/DAY is working out well, and she would like a 3 month supply sent to the [facility] on [address]. Please assist.

## 2013-06-19 NOTE — Telephone Encounter (Signed)
Refills sent by e-scribe.  Filled until next visit in Sept.

## 2013-06-26 ENCOUNTER — Other Ambulatory Visit: Payer: Self-pay | Admitting: Family Medicine

## 2013-06-26 MED ORDER — ESTRADIOL-NORETHINDRONE ACET 0.05-0.25 MG/DAY TD PTTW: 1.0000 | MEDICATED_PATCH | TRANSDERMAL | Status: DC

## 2013-06-30 ENCOUNTER — Other Ambulatory Visit: Payer: Self-pay | Admitting: Internal Medicine

## 2013-08-29 ENCOUNTER — Encounter: Payer: Self-pay | Admitting: Internal Medicine

## 2013-08-29 ENCOUNTER — Ambulatory Visit (INDEPENDENT_AMBULATORY_CARE_PROVIDER_SITE_OTHER): Payer: 59 | Admitting: Internal Medicine

## 2013-08-29 VITALS — BP 104/72 | HR 85 | Temp 98.2°F | Wt 170.0 lb

## 2013-08-29 DIAGNOSIS — N951 Menopausal and female climacteric states: Secondary | ICD-10-CM

## 2013-08-29 DIAGNOSIS — E039 Hypothyroidism, unspecified: Secondary | ICD-10-CM

## 2013-08-29 DIAGNOSIS — M25649 Stiffness of unspecified hand, not elsewhere classified: Secondary | ICD-10-CM

## 2013-08-29 DIAGNOSIS — Z79890 Hormone replacement therapy (postmenopausal): Secondary | ICD-10-CM

## 2013-08-29 MED ORDER — ESTRADIOL-NORETHINDRONE ACET 0.05-0.14 MG/DAY TD PTTW: 1.0000 | MEDICATED_PATCH | TRANSDERMAL | Status: DC

## 2013-08-29 NOTE — Patient Instructions (Addendum)
Can try doing patch  evey week instead of 2 x per week and then can try decreasing dose  As we discussed .  Other options available to use the least amount of     Hormone to control symptoms.   Contact us  After you try these methods to decide of any changes needed.   Also thyroid  Levels can effect how you feel.

## 2013-08-29 NOTE — Progress Notes (Signed)
Chief Complaint  Patient presents with  . Follow-up    hrt    HPI: Patch has helped  Flushes and sleep is great!   but  Now breast tenderness and larges .  Feels bloating and  Breast sxx. Wonders if can take prn or other  No bleeding other se  Thyroid med being adjusted .   Having early am hand stiffness and very tight  No real pain no redness or warmth no other new jt sx  ROS: See pertinent positives and negatives per HPI. No fever  Past Medical History  Diagnosis Date  . Hypothyroid     Sp ablation  on meds per Dr Talmage Nap  . Hematuria     Dr Etta Grandchild 2001 Trigonitis. Ultrasound and cystoscopy  . Hyperlipidemia     Family History  Problem Relation Age of Onset  . Diabetes    . Hyperlipidemia    . Heart attack  2    father  . Thyroid disease      mom  . Diabetes Mother     History   Social History  . Marital Status: Single    Spouse Name: N/A    Number of Children: N/A  . Years of Education: N/A   Social History Main Topics  . Smoking status: Former Games developer  . Smokeless tobacco: None  . Alcohol Use: No  . Drug Use: No  . Sexual Activity: None   Other Topics Concern  . None   Social History Narrative   hh 1   Pet dog    Exercise  No etoh.   Sleep  7-8 hours.      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     Outpatient Encounter Prescriptions as of 08/29/2013  Medication Sig Dispense Refill  . CALCIUM-VITAMIN D PO Take by mouth 2 (two) times daily. 600-800      . estradiol-norethindrone (COMBIPATCH) 0.05-0.25 MG/DAY Place 1 patch onto the skin 2 (two) times a week.  24 patch  0  . Krill Oil 300 MG CAPS Take by mouth.      . levothyroxine (SYNTHROID, LEVOTHROID) 100 MCG tablet Take 100 mcg by mouth daily before breakfast.      . Multiple Vitamin (MULTIVITAMIN) tablet Take 1 tablet by mouth daily.        . simvastatin (ZOCOR) 20 MG tablet TAKE 1 TABLET AT BEDTIME  90 tablet  2  . estradiol-norethindrone (COMBIPATCH) 0.05-0.14 MG/DAY Place 1 patch onto the  skin 2 (two) times a week.  8 patch  12  . [DISCONTINUED] Estradiol-Norethindrone Acet 0.5-0.1 MG per tablet Take 1 tablet by mouth daily.  30 tablet  6   No facility-administered encounter medications on file as of 08/29/2013.    EXAM:  BP 104/72  Pulse 85  Temp(Src) 98.2 F (36.8 C) (Oral)  Wt 170 lb (77.111 kg)  BMI 32.94 kg/m2  SpO2 98%  LMP 05/30/2012  Body mass index is 32.94 kg/(m^2).  GENERAL: vitals reviewed and listed above, alert, oriented, appears well hydrated and in no acute distress Hands no effusion local swelling somewhat puffy.  Nl pulses  PSYCH: pleasant and cooperative, no obvious depression or anxiety  ASSESSMENT AND PLAN:  Discussed the following assessment and plan:  Menopausal symptoms - improved sleep and flushes but some se bloaating trial dec dose  preg or less frequent change  HYPOTHYROIDISM  Morning joint stiffness of hand, unspecified laterality Titrate and adjust med  If hands  Problematic after thyroid  and hrt steady then consider further evaluation.  rx for lower dose if needed  Contact us vua patient portal about progress and refills    -Patient advised to return or notify health care team  if symptoms worsen or persist or new concerns arise.  Patient Instructions  Can try doing patch  evey week instead of 2 x per week and then can try decreasing dose  As we discussed .  Other options available to use the least amount of     Hormone to control symptoms.   Contact us  After you try these methods to decide of any changes needed.   Also thyroid  Levels can effect how you feel.     Neta Mends. Jomarie Gellis M.D.

## 2013-08-30 DIAGNOSIS — Z79890 Hormone replacement therapy (postmenopausal): Secondary | ICD-10-CM

## 2013-08-30 HISTORY — DX: Hormone replacement therapy: Z79.890

## 2013-09-19 LAB — HM MAMMOGRAPHY

## 2013-09-20 ENCOUNTER — Encounter: Payer: Self-pay | Admitting: Internal Medicine

## 2013-10-05 ENCOUNTER — Other Ambulatory Visit: Payer: Self-pay

## 2013-10-21 ENCOUNTER — Encounter: Payer: Self-pay | Admitting: Internal Medicine

## 2013-10-24 ENCOUNTER — Other Ambulatory Visit: Payer: Self-pay | Admitting: Family Medicine

## 2013-10-24 MED ORDER — ESTRADIOL-NORETHINDRONE ACET 0.05-0.14 MG/DAY TD PTTW: 1.0000 | MEDICATED_PATCH | TRANSDERMAL | Status: DC

## 2014-08-30 ENCOUNTER — Other Ambulatory Visit (INDEPENDENT_AMBULATORY_CARE_PROVIDER_SITE_OTHER): Payer: 59

## 2014-08-30 DIAGNOSIS — Z0000 Encounter for general adult medical examination without abnormal findings: Secondary | ICD-10-CM

## 2014-08-30 LAB — CBC WITH DIFFERENTIAL/PLATELET
Basophils Absolute: 0.1 10*3/uL (ref 0.0–0.1)
Basophils Relative: 0.6 % (ref 0.0–3.0)
EOS PCT: 2.6 % (ref 0.0–5.0)
Eosinophils Absolute: 0.2 10*3/uL (ref 0.0–0.7)
HEMATOCRIT: 40.6 % (ref 36.0–46.0)
HEMOGLOBIN: 13.4 g/dL (ref 12.0–15.0)
LYMPHS ABS: 2.7 10*3/uL (ref 0.7–4.0)
Lymphocytes Relative: 31.3 % (ref 12.0–46.0)
MCHC: 33 g/dL (ref 30.0–36.0)
MCV: 91 fl (ref 78.0–100.0)
MONOS PCT: 6.5 % (ref 3.0–12.0)
Monocytes Absolute: 0.6 10*3/uL (ref 0.1–1.0)
NEUTROS ABS: 5.1 10*3/uL (ref 1.4–7.7)
Neutrophils Relative %: 59 % (ref 43.0–77.0)
PLATELETS: 214 10*3/uL (ref 150.0–400.0)
RBC: 4.46 Mil/uL (ref 3.87–5.11)
RDW: 14 % (ref 11.5–15.5)
WBC: 8.6 10*3/uL (ref 4.0–10.5)

## 2014-08-30 LAB — LIPID PANEL
Cholesterol: 190 mg/dL (ref 0–200)
HDL: 41.6 mg/dL (ref >39.00)
LDL Cholesterol: 123 mg/dL — ABNORMAL HIGH (ref 0–99)
NONHDL: 148.4
Total CHOL/HDL Ratio: 5
Triglycerides: 125 mg/dL (ref 0.0–149.0)
VLDL: 25 mg/dL (ref 0.0–40.0)

## 2014-08-30 LAB — BASIC METABOLIC PANEL
BUN: 16 mg/dL (ref 6–23)
CO2: 29 mEq/L (ref 19–32)
Calcium: 9.4 mg/dL (ref 8.4–10.5)
Chloride: 107 mEq/L (ref 96–112)
Creatinine, Ser: 0.8 mg/dL (ref 0.4–1.2)
GFR: 79.38 mL/min (ref >60.00)
Glucose, Bld: 105 mg/dL — ABNORMAL HIGH (ref 70–99)
POTASSIUM: 4.1 meq/L (ref 3.5–5.1)
SODIUM: 139 meq/L (ref 135–145)

## 2014-08-30 LAB — HEPATIC FUNCTION PANEL
ALT: 16 U/L (ref 0–35)
AST: 17 U/L (ref 0–37)
Albumin: 4 g/dL (ref 3.5–5.2)
Alkaline Phosphatase: 60 U/L (ref 39–117)
BILIRUBIN TOTAL: 0.7 mg/dL (ref 0.2–1.2)
Bilirubin, Direct: 0 mg/dL (ref 0.0–0.3)
Total Protein: 7.3 g/dL (ref 6.0–8.3)

## 2014-08-30 LAB — TSH: TSH: 0.47 u[IU]/mL (ref 0.35–4.50)

## 2014-09-19 ENCOUNTER — Ambulatory Visit (INDEPENDENT_AMBULATORY_CARE_PROVIDER_SITE_OTHER): Payer: 59 | Admitting: Internal Medicine

## 2014-09-19 ENCOUNTER — Encounter: Payer: Self-pay | Admitting: Internal Medicine

## 2014-09-19 VITALS — BP 122/72 | Temp 98.6°F | Ht 60.25 in | Wt 159.0 lb

## 2014-09-19 DIAGNOSIS — Z0000 Encounter for general adult medical examination without abnormal findings: Secondary | ICD-10-CM

## 2014-09-19 DIAGNOSIS — Z79890 Hormone replacement therapy (postmenopausal): Secondary | ICD-10-CM

## 2014-09-19 DIAGNOSIS — E785 Hyperlipidemia, unspecified: Secondary | ICD-10-CM

## 2014-09-19 DIAGNOSIS — E039 Hypothyroidism, unspecified: Secondary | ICD-10-CM

## 2014-09-19 DIAGNOSIS — Z23 Encounter for immunization: Secondary | ICD-10-CM

## 2014-09-19 DIAGNOSIS — R739 Hyperglycemia, unspecified: Secondary | ICD-10-CM

## 2014-09-19 NOTE — Progress Notes (Signed)
Pre visit review using our clinic review tool, if applicable. No additional management support is needed unless otherwise documented below in the visit note.  Chief Complaint  Patient presents with  . Annual Exam    HPI: Patient comes in today for Preventive Health Care visit   Lost 22 pounds after  elevaetd bg at dr Porfirio Oar   Last 6.6 a1c  adn fbs 89   Also then down to 6.2  Thyroid. adjusted per dr Chalmers Cater doing ok  On hrt patch using one a week mostly  Some help with sleep no flushes   Health Maintenance  Topic Date Due  . Tetanus/tdap  12/31/2014  . Pap Smear  02/23/2015  . Influenza Vaccine  07/01/2015  . Mammogram  09/20/2015  . Colonoscopy  04/07/2021   Health Maintenance Review LIFESTYLE:  Exercise:  Walking at least 5 days per week  Tobacco/ETS: no Alcohol:  None  Sugar beverages: coffee  1 tablespoon creamer .  Sleep:7 interuppted  Drug use: no Bone density:  Per dr Chalmers Cater.  Last one in 2013 . To get another.  Colonoscopy:  Age 17  mammo in December   ROS:  GEN/ HEENT: No fever, significant weight changes sweats headaches vision problems hearing changes, CV/ PULM; No chest pain shortness of breath cough, syncope,edema  change in exercise tolerance. GI /GU: No adominal pain, vomiting, change in bowel habits. No blood in the stool. No significant GU symptoms. SKIN/HEME: ,no acute skin rashes suspicious lesions or bleeding. No lymphadenopathy, nodules, masses.  NEURO/ PSYCH:  No neurologic signs such as weakness numbness. No depression anxiety. IMM/ Allergy: No unusual infections.  Allergy .   REST of 12 system review negative except as per HPI  Past Medical History  Diagnosis Date  . Hypothyroid     Sp ablation  on meds per Dr Chalmers Cater  . Hematuria     Dr Joelyn Oms 2001 Trigonitis. Ultrasound and cystoscopy  . Hyperlipidemia     Family History  Problem Relation Age of Onset  . Diabetes    . Hyperlipidemia    . Heart attack  55    father  . Thyroid disease        mom  . Diabetes Mother     History   Social History  . Marital Status: Single    Spouse Name: N/A    Number of Children: N/A  . Years of Education: N/A   Social History Main Topics  . Smoking status: Former Research scientist (life sciences)  . Smokeless tobacco: None  . Alcohol Use: No  . Drug Use: No  . Sexual Activity: None   Other Topics Concern  . None   Social History Narrative   hh 1  +   Pet dog  Passed in June 15    Exercise  No etoh.   Sleep  7-8 hours.      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     Outpatient Encounter Prescriptions as of 09/19/2014  Medication Sig  . CALCIUM CITRATE-VITAMIN D PO Take by mouth. 1000 MG OF Calcium and 500 iu of vitamin D  . estradiol-norethindrone (COMBIPATCH) 0.05-0.14 MG/DAY Place 1 patch onto the skin once a week.  Javier Docker Oil 300 MG CAPS Take by mouth.  . levothyroxine (SYNTHROID, LEVOTHROID) 100 MCG tablet Take 100 mcg by mouth daily before breakfast.  . Multiple Vitamin (MULTIVITAMIN) tablet Take 1 tablet by mouth daily.    . simvastatin (ZOCOR) 20 MG tablet TAKE 1 TABLET AT  BEDTIME  . [DISCONTINUED] CALCIUM-VITAMIN D PO Take by mouth daily. 600-800  . [DISCONTINUED] estradiol-norethindrone (COMBIPATCH) 0.05-0.14 MG/DAY Place 1 patch onto the skin 2 (two) times a week.    EXAM:  BP 122/72  Temp(Src) 98.6 F (37 C) (Oral)  Ht 5' 0.25" (1.53 m)  Wt 159 lb (72.122 kg)  BMI 30.81 kg/m2  LMP 05/30/2012  Body mass index is 30.81 kg/(m^2).  Physical Exam: Vital signs reviewed BEM:LJQG is a well-developed well-nourished alert cooperative    who appearsr stated age in no acute distress.  HEENT: normocephalic atraumatic , Eyes: PERRL EOM's full, conjunctiva clear, Nares: paten,t no deformity discharge or tenderness., Ears: no deformity EAC's clear TMs with normal landmarks. Mouth: clear OP, no lesions, edema.  Moist mucous membranes. Dentition in adequate repair. NECK: supple without masses, thyromegaly or bruits. CHEST/PULM:  Clear to  auscultation and percussion breath sounds equal no wheeze , rales or rhonchi. No chest wall deformities or tenderness. Breast: normal by inspection . No dimpling, discharge, masses, tenderness or discharge . CV: PMI is nondisplaced, S1 S2 no gallops, short sem when supine only non radiating no rubs. Peripheral pulses are full without delay.No JVD .  ABDOMEN: Bowel sounds normal nontender  No guard or rebound, no hepato splenomegal no CVA tenderness.  No hernia. Extremtities:  No clubbing cyanosis or edema, no acute joint swelling or redness no focal atrophy NEURO:  Oriented x3, cranial nerves 3-12 appear to be intact, no obvious focal weakness,gait within normal limits no abnormal reflexes or asymmetrical SKIN: No acute rashes normal turgor, color, no bruising or petechiae. PSYCH: Oriented, good eye contact, no obvious depression anxiety, cognition and judgment appear normal. LN: no cervical axillary inguinal adenopathy   Lab Results  Component Value Date   WBC 8.6 08/30/2014   HGB 13.4 08/30/2014   HCT 40.6 08/30/2014   PLT 214.0 08/30/2014   GLUCOSE 105* 08/30/2014   CHOL 190 08/30/2014   TRIG 125.0 08/30/2014   HDL 41.60 08/30/2014   LDLDIRECT 108.6 01/29/2009   LDLCALC 123* 08/30/2014   ALT 16 08/30/2014   AST 17 08/30/2014   NA 139 08/30/2014   K 4.1 08/30/2014   CL 107 08/30/2014   CREATININE 0.8 08/30/2014   BUN 16 08/30/2014   CO2 29 08/30/2014   TSH 0.47 08/30/2014    ASSESSMENT AND PLAN:  Discussed the following assessment and plan:  Visit for preventive health examination - advised flu vaccine given today parameters reveiwedpap next year   Hypothyroidism, unspecified hypothyroidism type  Hyperlipidemia  Hormone replacement therapy (HRT) - using weekly helps with sleep   Encounter for immunization  Hyperglycemia - followed dr Chalmers Cater better lsi  Patient Care Team: Burnis Medin, MD as PCP - General Jacelyn Pi, MD (Endocrinology) Milus Banister, MD  (Gastroenterology) Patient Instructions  Continue lifestyle intervention healthy eating and exercise . Healthy lifestyle includes : At least 150 minutes of exercise weeks  , weight at healthy levels, which is usually   BMI 19-25. Avoid trans fats and processed foods;  Increase fresh fruits and veges to 5 servings per day. And avoid sweet beverages including tea and juice. Mediterranean diet with olive oil and nuts have been noted to be heart and brain healthy . Avoid tobacco products . Limit  alcohol to  7 per week for women and 14 servings for men.  Get adequate sleep . Wear seat belts . Don't text and drive .   Get [place]  Copy of  Labs and bone  density when done.   PAP smear next year  .    Standley Brooking. Panosh M.D.

## 2014-09-19 NOTE — Patient Instructions (Signed)
Continue lifestyle intervention healthy eating and exercise . Healthy lifestyle includes : At least 150 minutes of exercise weeks  , weight at healthy levels, which is usually   BMI 19-25. Avoid trans fats and processed foods;  Increase fresh fruits and veges to 5 servings per day. And avoid sweet beverages including tea and juice. Mediterranean diet with olive oil and nuts have been noted to be heart and brain healthy . Avoid tobacco products . Limit  alcohol to  7 per week for women and 14 servings for men.  Get adequate sleep . Wear seat belts . Don't text and drive .   Get [place]  Copy of  Labs and bone density when done.   PAP smear next year  .

## 2014-10-05 ENCOUNTER — Encounter: Payer: Self-pay | Admitting: Internal Medicine

## 2014-10-22 ENCOUNTER — Other Ambulatory Visit: Payer: Self-pay | Admitting: Internal Medicine

## 2014-10-22 NOTE — Telephone Encounter (Signed)
Sent to the pharmacy by e-scribe. 

## 2014-11-05 LAB — HM MAMMOGRAPHY

## 2014-11-09 ENCOUNTER — Encounter: Payer: Self-pay | Admitting: Family Medicine

## 2014-11-16 ENCOUNTER — Telehealth: Payer: Self-pay | Admitting: Internal Medicine

## 2014-11-16 NOTE — Telephone Encounter (Signed)
Pt was seen on 09/19/14 for a physical and per pt she gave misty biometric form to be completed and fax. Per pt form was never received

## 2014-11-19 ENCOUNTER — Encounter: Payer: Self-pay | Admitting: Internal Medicine

## 2014-11-19 NOTE — Telephone Encounter (Signed)
Fax resent.  Received fax transmission that it was successful.

## 2015-09-22 ENCOUNTER — Encounter: Payer: Self-pay | Admitting: Internal Medicine

## 2015-09-24 NOTE — Telephone Encounter (Signed)
  Not advised based on information given .  TSH is adequate test.  If she is on  Thyroid medicine  Adding those tests are not helpful  No indicated usually.  She should discuss this with her endocrinologist  If  More tests are indicated.

## 2015-09-25 ENCOUNTER — Other Ambulatory Visit: Payer: Self-pay | Admitting: Family Medicine

## 2015-09-25 DIAGNOSIS — E038 Other specified hypothyroidism: Secondary | ICD-10-CM

## 2015-09-26 ENCOUNTER — Other Ambulatory Visit (INDEPENDENT_AMBULATORY_CARE_PROVIDER_SITE_OTHER): Payer: 59

## 2015-09-26 DIAGNOSIS — E038 Other specified hypothyroidism: Secondary | ICD-10-CM | POA: Diagnosis not present

## 2015-09-26 DIAGNOSIS — Z0000 Encounter for general adult medical examination without abnormal findings: Secondary | ICD-10-CM

## 2015-09-26 LAB — CBC WITH DIFFERENTIAL/PLATELET
BASOS ABS: 0.1 10*3/uL (ref 0.0–0.1)
BASOS PCT: 0.7 % (ref 0.0–3.0)
EOS ABS: 0.2 10*3/uL (ref 0.0–0.7)
Eosinophils Relative: 2.6 % (ref 0.0–5.0)
HEMATOCRIT: 41.7 % (ref 36.0–46.0)
HEMOGLOBIN: 13.6 g/dL (ref 12.0–15.0)
LYMPHS PCT: 31.3 % (ref 12.0–46.0)
Lymphs Abs: 2.8 10*3/uL (ref 0.7–4.0)
MCHC: 32.7 g/dL (ref 30.0–36.0)
MCV: 90.9 fl (ref 78.0–100.0)
MONO ABS: 0.6 10*3/uL (ref 0.1–1.0)
Monocytes Relative: 6.9 % (ref 3.0–12.0)
NEUTROS ABS: 5.3 10*3/uL (ref 1.4–7.7)
Neutrophils Relative %: 58.5 % (ref 43.0–77.0)
PLATELETS: 261 10*3/uL (ref 150.0–400.0)
RBC: 4.58 Mil/uL (ref 3.87–5.11)
RDW: 14.3 % (ref 11.5–15.5)
WBC: 9 10*3/uL (ref 4.0–10.5)

## 2015-09-26 LAB — BASIC METABOLIC PANEL
BUN: 15 mg/dL (ref 6–23)
CALCIUM: 9.7 mg/dL (ref 8.4–10.5)
CHLORIDE: 105 meq/L (ref 96–112)
CO2: 26 mEq/L (ref 19–32)
CREATININE: 0.81 mg/dL (ref 0.40–1.20)
GFR: 77.94 mL/min (ref >60.00)
Glucose, Bld: 116 mg/dL — ABNORMAL HIGH (ref 70–99)
Potassium: 4.3 mEq/L (ref 3.5–5.1)
Sodium: 141 mEq/L (ref 135–145)

## 2015-09-26 LAB — LIPID PANEL
Cholesterol: 188 mg/dL (ref 0–200)
HDL: 43.4 mg/dL (ref >39.00)
LDL CALC: 116 mg/dL — AB (ref 0–99)
NONHDL: 144.71
TRIGLYCERIDES: 146 mg/dL (ref 0.0–149.0)
VLDL: 29.2 mg/dL (ref 0.0–40.0)

## 2015-09-26 LAB — TSH: TSH: 1.35 u[IU]/mL (ref 0.35–4.50)

## 2015-09-26 LAB — HEPATIC FUNCTION PANEL
ALK PHOS: 64 U/L (ref 39–117)
ALT: 17 U/L (ref 0–35)
AST: 19 U/L (ref 0–37)
Albumin: 4.2 g/dL (ref 3.5–5.2)
BILIRUBIN DIRECT: 0.1 mg/dL (ref 0.0–0.3)
BILIRUBIN TOTAL: 0.6 mg/dL (ref 0.2–1.2)
Total Protein: 7.3 g/dL (ref 6.0–8.3)

## 2015-09-26 LAB — T3, FREE: T3, Free: 3.5 pg/mL (ref 2.3–4.2)

## 2015-09-26 LAB — T4, FREE: Free T4: 1.01 ng/dL (ref 0.60–1.60)

## 2015-10-02 ENCOUNTER — Ambulatory Visit (INDEPENDENT_AMBULATORY_CARE_PROVIDER_SITE_OTHER): Payer: 59 | Admitting: Internal Medicine

## 2015-10-02 ENCOUNTER — Encounter: Payer: Self-pay | Admitting: Internal Medicine

## 2015-10-02 ENCOUNTER — Other Ambulatory Visit (HOSPITAL_COMMUNITY)
Admission: RE | Admit: 2015-10-02 | Discharge: 2015-10-02 | Disposition: A | Discharge status: Home / Self Care | Payer: 59 | Source: Ambulatory Visit | Attending: Internal Medicine | Admitting: Internal Medicine

## 2015-10-02 VITALS — BP 114/76 | Temp 98.8°F | Ht 60.25 in | Wt 169.0 lb

## 2015-10-02 DIAGNOSIS — E785 Hyperlipidemia, unspecified: Secondary | ICD-10-CM | POA: Diagnosis not present

## 2015-10-02 DIAGNOSIS — E039 Hypothyroidism, unspecified: Secondary | ICD-10-CM

## 2015-10-02 DIAGNOSIS — Z01419 Encounter for gynecological examination (general) (routine) without abnormal findings: Secondary | ICD-10-CM

## 2015-10-02 DIAGNOSIS — Z23 Encounter for immunization: Secondary | ICD-10-CM | POA: Diagnosis not present

## 2015-10-02 DIAGNOSIS — Z79890 Hormone replacement therapy (postmenopausal): Secondary | ICD-10-CM

## 2015-10-02 DIAGNOSIS — Z0000 Encounter for general adult medical examination without abnormal findings: Secondary | ICD-10-CM

## 2015-10-02 DIAGNOSIS — Z6832 Body mass index (BMI) 32.0-32.9, adult: Secondary | ICD-10-CM

## 2015-10-02 DIAGNOSIS — R739 Hyperglycemia, unspecified: Secondary | ICD-10-CM

## 2015-10-02 DIAGNOSIS — Z79899 Other long term (current) drug therapy: Secondary | ICD-10-CM

## 2015-10-02 DIAGNOSIS — Z1151 Encounter for screening for human papillomavirus (HPV): Secondary | ICD-10-CM | POA: Diagnosis not present

## 2015-10-02 NOTE — Patient Instructions (Signed)
Intensify lifestyle interventions. For now can titrated the hormones to prevent symptoms   Shingles vaccine  Advised at age 55  For reasons discussed .  We can send you to nutritionist  .   To help with  Diet choices.  Make sure  Doing resistance training .     Repeat lipid and blood sugar at about 6 months and then rov to see how you are doing.     Health Maintenance, Female Adopting a healthy lifestyle and getting preventive care can go a long way to promote health and wellness. Talk with your health care provider about what schedule of regular examinations is right for you. This is a good chance for you to check in with your provider about disease prevention and staying healthy. In between checkups, there are plenty of things you can do on your own. Experts have done a lot of research about which lifestyle changes and preventive measures are most likely to keep you healthy. Ask your health care provider for more information. WEIGHT AND DIET  Eat a healthy diet  Be sure to include plenty of vegetables, fruits, low-fat dairy products, and lean protein.  Do not eat a lot of foods high in solid fats, added sugars, or salt.  Get regular exercise. This is one of the most important things you can do for your health.  Most adults should exercise for at least 150 minutes each week. The exercise should increase your heart rate and make you sweat (moderate-intensity exercise).  Most adults should also do strengthening exercises at least twice a week. This is in addition to the moderate-intensity exercise.  Maintain a healthy weight  Body mass index (BMI) is a measurement that can be used to identify possible weight problems. It estimates body fat based on height and weight. Your health care provider can help determine your BMI and help you achieve or maintain a healthy weight.  For females 83 years of age and older:   A BMI below 18.5 is considered underweight.  A BMI of 18.5 to 24.9 is  normal.  A BMI of 25 to 29.9 is considered overweight.  A BMI of 30 and above is considered obese.  Watch levels of cholesterol and blood lipids  You should start having your blood tested for lipids and cholesterol at 55 years of age, then have this test every 5 years.  You may need to have your cholesterol levels checked more often if:  Your lipid or cholesterol levels are high.  You are older than 55 years of age.  You are at high risk for heart disease.  CANCER SCREENING   Lung Cancer  Lung cancer screening is recommended for adults 49-55 years old who are at high risk for lung cancer because of a history of smoking.  A yearly low-dose CT scan of the lungs is recommended for people who:  Currently smoke.  Have quit within the past 15 years.  Have at least a 30-pack-year history of smoking. A pack year is smoking an average of one pack of cigarettes a day for 1 year.  Yearly screening should continue until it has been 15 years since you quit.  Yearly screening should stop if you develop a health problem that would prevent you from having lung cancer treatment.  Breast Cancer  Practice breast self-awareness. This means understanding how your breasts normally appear and feel.  It also means doing regular breast self-exams. Let your health care provider know about any changes, no matter  how small.  If you are in your 20s or 30s, you should have a clinical breast exam (CBE) by a health care provider every 1-3 years as part of a regular health exam.  If you are 67 or older, have a CBE every year. Also consider having a breast X-ray (mammogram) every year.  If you have a family history of breast cancer, talk to your health care provider about genetic screening.  If you are at high risk for breast cancer, talk to your health care provider about having an MRI and a mammogram every year.  Breast cancer gene (BRCA) assessment is recommended for women who have family members  with BRCA-related cancers. BRCA-related cancers include:  Breast.  Ovarian.  Tubal.  Peritoneal cancers.  Results of the assessment will determine the need for genetic counseling and BRCA1 and BRCA2 testing. Cervical Cancer Your health care provider may recommend that you be screened regularly for cancer of the pelvic organs (ovaries, uterus, and vagina). This screening involves a pelvic examination, including checking for microscopic changes to the surface of your cervix (Pap test). You may be encouraged to have this screening done every 3 years, beginning at age 54.  For women ages 52-65, health care providers may recommend pelvic exams and Pap testing every 3 years, or they may recommend the Pap and pelvic exam, combined with testing for human papilloma virus (HPV), every 5 years. Some types of HPV increase your risk of cervical cancer. Testing for HPV may also be done on women of any age with unclear Pap test results.  Other health care providers may not recommend any screening for nonpregnant women who are considered low risk for pelvic cancer and who do not have symptoms. Ask your health care provider if a screening pelvic exam is right for you.  If you have had past treatment for cervical cancer or a condition that could lead to cancer, you need Pap tests and screening for cancer for at least 20 years after your treatment. If Pap tests have been discontinued, your risk factors (such as having a new sexual partner) need to be reassessed to determine if screening should resume. Some women have medical problems that increase the chance of getting cervical cancer. In these cases, your health care provider may recommend more frequent screening and Pap tests. Colorectal Cancer  This type of cancer can be detected and often prevented.  Routine colorectal cancer screening usually begins at 55 years of age and continues through 55 years of age.  Your health care provider may recommend  screening at an earlier age if you have risk factors for colon cancer.  Your health care provider may also recommend using home test kits to check for hidden blood in the stool.  A small camera at the end of a tube can be used to examine your colon directly (sigmoidoscopy or colonoscopy). This is done to check for the earliest forms of colorectal cancer.  Routine screening usually begins at age 23.  Direct examination of the colon should be repeated every 5-10 years through 55 years of age. However, you may need to be screened more often if early forms of precancerous polyps or small growths are found. Skin Cancer  Check your skin from head to toe regularly.  Tell your health care provider about any new moles or changes in moles, especially if there is a change in a mole's shape or color.  Also tell your health care provider if you have a mole that is  larger than the size of a pencil eraser.  Always use sunscreen. Apply sunscreen liberally and repeatedly throughout the day.  Protect yourself by wearing long sleeves, pants, a wide-brimmed hat, and sunglasses whenever you are outside. HEART DISEASE, DIABETES, AND HIGH BLOOD PRESSURE   High blood pressure causes heart disease and increases the risk of stroke. High blood pressure is more likely to develop in:  People who have blood pressure in the high end of the normal range (130-139/85-89 mm Hg).  People who are overweight or obese.  People who are African American.  If you are 23-28 years of age, have your blood pressure checked every 3-5 years. If you are 48 years of age or older, have your blood pressure checked every year. You should have your blood pressure measured twice--once when you are at a hospital or clinic, and once when you are not at a hospital or clinic. Record the average of the two measurements. To check your blood pressure when you are not at a hospital or clinic, you can use:  An automated blood pressure machine at a  pharmacy.  A home blood pressure monitor.  If you are between 21 years and 17 years old, ask your health care provider if you should take aspirin to prevent strokes.  Have regular diabetes screenings. This involves taking a blood sample to check your fasting blood sugar level.  If you are at a normal weight and have a low risk for diabetes, have this test once every three years after 55 years of age.  If you are overweight and have a high risk for diabetes, consider being tested at a younger age or more often. PREVENTING INFECTION  Hepatitis B  If you have a higher risk for hepatitis B, you should be screened for this virus. You are considered at high risk for hepatitis B if:  You were born in a country where hepatitis B is common. Ask your health care provider which countries are considered high risk.  Your parents were born in a high-risk country, and you have not been immunized against hepatitis B (hepatitis B vaccine).  You have HIV or AIDS.  You use needles to inject street drugs.  You live with someone who has hepatitis B.  You have had sex with someone who has hepatitis B.  You get hemodialysis treatment.  You take certain medicines for conditions, including cancer, organ transplantation, and autoimmune conditions. Hepatitis C  Blood testing is recommended for:  Everyone born from [birth year] through 1966.  Anyone with known risk factors for hepatitis C. Sexually transmitted infections (STIs)  You should be screened for sexually transmitted infections (STIs) including gonorrhea and chlamydia if:  You are sexually active and are younger than 55 years of age.  You are older than 55 years of age and your health care provider tells you that you are at risk for this type of infection.  Your sexual activity has changed since you were last screened and you are at an increased risk for chlamydia or gonorrhea. Ask your health care provider if you are at risk.  If you do not  have HIV, but are at risk, it may be recommended that you take a prescription medicine daily to prevent HIV infection. This is called pre-exposure prophylaxis (PrEP). You are considered at risk if:  You are sexually active and do not regularly use condoms or know the HIV status of your partner(s).  You take drugs by injection.  You are sexually active with  a partner who has HIV. Talk with your health care provider about whether you are at high risk of being infected with HIV. If you choose to begin PrEP, you should first be tested for HIV. You should then be tested every 3 months for as long as you are taking PrEP.  PREGNANCY   If you are premenopausal and you may become pregnant, ask your health care provider about preconception counseling.  If you may become pregnant, take 400 to 800 micrograms (mcg) of folic acid every day.  If you want to prevent pregnancy, talk to your health care provider about birth control (contraception). OSTEOPOROSIS AND MENOPAUSE   Osteoporosis is a disease in which the bones lose minerals and strength with aging. This can result in serious bone fractures. Your risk for osteoporosis can be identified using a bone density scan.  If you are 77 years of age or older, or if you are at risk for osteoporosis and fractures, ask your health care provider if you should be screened.  Ask your health care provider whether you should take a calcium or vitamin D supplement to lower your risk for osteoporosis.  Menopause may have certain physical symptoms and risks.  Hormone replacement therapy may reduce some of these symptoms and risks. Talk to your health care provider about whether hormone replacement therapy is right for you.  HOME CARE INSTRUCTIONS   Schedule regular health, dental, and eye exams.  Stay current with your immunizations.   Do not use any tobacco products including cigarettes, chewing tobacco, or electronic cigarettes.  If you are pregnant, do not  drink alcohol.  If you are breastfeeding, limit how much and how often you drink alcohol.  Limit alcohol intake to no more than 1 drink per day for nonpregnant women. One drink equals 12 ounces of beer, 5 ounces of wine, or 1 ounces of hard liquor.  Do not use street drugs.  Do not share needles.  Ask your health care provider for help if you need support or information about quitting drugs.  Tell your health care provider if you often feel depressed.  Tell your health care provider if you have ever been abused or do not feel safe at home.   This information is not intended to replace advice given to you by your health care provider. Make sure you discuss any questions you have with your health care provider.   Document Released: 06/01/2011 Document Revised: 12/07/2014 Document Reviewed: 10/18/2013 Elsevier Interactive Patient Education [employer].     Why follow it? Research shows. . Those who follow the Mediterranean diet have a reduced risk of heart disease  . The diet is associated with a reduced incidence of Parkinson's and Alzheimer's diseases . People following the diet may have longer life expectancies and lower rates of chronic diseases  . The Dietary Guidelines for Americans recommends the Mediterranean diet as an eating plan to promote health and prevent disease  What Is the Mediterranean Diet?  . Healthy eating plan based on typical foods and recipes of Mediterranean-style cooking . The diet is primarily a plant based diet; these foods should make up a majority of meals   Starches - Plant based foods should make up a majority of meals - They are an important sources of vitamins, minerals, energy, antioxidants, and fiber - Choose whole grains, foods high in fiber and minimally processed items  - Typical grain sources include wheat, oats, barley, corn, brown rice, bulgar, farro, millet, polenta, couscous  -  Various types of beans include chickpeas, lentils, fava  beans, black beans, white beans   Fruits  Veggies - Large quantities of antioxidant rich fruits & veggies; 6 or more servings  - Vegetables can be eaten raw or lightly drizzled with oil and cooked  - Vegetables common to the traditional Mediterranean Diet include: artichokes, arugula, beets, broccoli, brussel sprouts, cabbage, carrots, celery, collard greens, cucumbers, eggplant, kale, leeks, lemons, lettuce, mushrooms, okra, onions, peas, peppers, potatoes, pumpkin, radishes, rutabaga, shallots, spinach, sweet potatoes, turnips, zucchini - Fruits common to the Mediterranean Diet include: apples, apricots, avocados, cherries, clementines, dates, figs, grapefruits, grapes, melons, nectarines, oranges, peaches, pears, pomegranates, strawberries, tangerines  Fats - Replace butter and margarine with healthy oils, such as olive oil, canola oil, and tahini  - Limit nuts to no more than a handful a day  - Nuts include walnuts, almonds, pecans, pistachios, pine nuts  - Limit or avoid candied, honey roasted or heavily salted nuts - Olives are central to the [employer] - can be eaten whole or used in a variety of dishes   Meats Protein - Limiting red meat: no more than a few times a month - When eating red meat: choose lean cuts and keep the portion to the size of deck of cards - Eggs: approx. 0 to 4 times a week  - Fish and lean poultry: at least 2 a week  - Healthy protein sources include, chicken, [place], lean beef, lamb - Increase intake of seafood such as tuna, salmon, trout, mackerel, shrimp, scallops - Avoid or limit high fat processed meats such as sausage and bacon  Dairy - Include moderate amounts of low fat dairy products  - Focus on healthy dairy such as fat free yogurt, skim milk, low or reduced fat cheese - Limit dairy products higher in fat such as whole or 2% milk, cheese, ice cream  Alcohol - Moderate amounts of red wine is ok  - No more than 5 oz daily for women (all ages) and  men older than age 61  - No more than 10 oz of wine daily for men younger than 24  Other - Limit sweets and other desserts  - Use herbs and spices instead of salt to flavor foods  - Herbs and spices common to the traditional Mediterranean Diet include: basil, bay leaves, chives, cloves, cumin, fennel, garlic, lavender, marjoram, mint, oregano, parsley, pepper, rosemary, sage, savory, sumac, tarragon, thyme   It's not just a diet, it's a lifestyle:  . The Mediterranean diet includes lifestyle factors typical of those in the region  . Foods, drinks and meals are best eaten with others and savored . Daily physical activity is important for overall good health . This could be strenuous exercise like running and aerobics . This could also be more leisurely activities such as walking, housework, yard-work, or taking the stairs . Moderation is the key; a balanced and healthy diet accommodates most foods and drinks . Consider portion sizes and frequency of consumption of certain foods   Meal Ideas & Options:  . Breakfast:  o Whole wheat toast or whole wheat English muffins with peanut butter & hard boiled egg o Steel cut oats topped with apples & cinnamon and skim milk  o Fresh fruit: banana, strawberries, melon, berries, peaches  o Smoothies: strawberries, bananas, greek yogurt, peanut butter o Low fat greek yogurt with blueberries and granola  o Egg white omelet with spinach and mushrooms o Breakfast couscous: whole wheat couscous, apricots, skim  milk, cranberries  . Sandwiches:  o Hummus and grilled vegetables (peppers, zucchini, squash) on whole wheat bread   o Grilled chicken on whole wheat pita with lettuce, tomatoes, cucumbers or tzatziki  o Tuna salad on whole wheat bread: tuna salad made with greek yogurt, olives, red peppers, capers, green onions o Garlic rosemary lamb pita: lamb sauted with garlic, rosemary, salt & pepper; add lettuce, cucumber, greek yogurt to pita - flavor with  lemon juice and black pepper  . Seafood:  o Mediterranean grilled salmon, seasoned with garlic, basil, parsley, lemon juice and black pepper o Shrimp, lemon, and spinach whole-grain pasta salad made with low fat greek yogurt  o Seared scallops with lemon orzo  o Seared tuna steaks seasoned salt, pepper, coriander topped with tomato mixture of olives, tomatoes, olive oil, minced garlic, parsley, green onions and cappers  . Meats:  o Herbed greek chicken salad with kalamata olives, cucumber, feta  o Red bell peppers stuffed with spinach, bulgur, lean ground beef (or lentils) & topped with feta   o Kebabs: skewers of chicken, tomatoes, onions, zucchini, squash  o [place] burgers: made with red onions, mint, dill, lemon juice, feta cheese topped with roasted red peppers . Vegetarian o Cucumber salad: cucumbers, artichoke hearts, celery, red onion, feta cheese, tossed in olive oil & lemon juice  o Hummus and whole grain pita points with a greek salad (lettuce, tomato, feta, olives, cucumbers, red onion) o Lentil soup with celery, carrots made with vegetable broth, garlic, salt and pepper  o Tabouli salad: parsley, bulgur, mint, scallions, cucumbers, tomato, radishes, lemon juice, olive oil, salt and pepper.

## 2015-10-02 NOTE — Progress Notes (Signed)
Pre visit review using our clinic review tool, if applicable. No additional management support is needed unless otherwise documented below in the visit note.  Chief Complaint  Patient presents with  . Annual Exam    pap concern about weight gain    HPI: Patient  Sarah Floyd  54 y.o. comes in today for Preventive Health Care visit  And Chronic disease management Having difficulty with weight management and gaining  Some  using nutrasystem.  As in past some exercise not as much . Thyroid in range per dr Chalmers Cater  Due soon Using hrt with help  About once a week  When misses  More than that gets flushes and feels bad.  No vag bleeding menopausal Taking lipid med no se given but dr Chalmers Cater?  Health Maintenance  Topic Date Due  . Hepatitis C Screening  1960-12-18  . PAP SMEAR  02/23/2015  . HIV Screening  10/01/2016 (Originally 06/05/1976)  . INFLUENZA VACCINE  06/30/2016  . MAMMOGRAM  11/07/2016  . COLONOSCOPY  04/07/2021  . TETANUS/TDAP  10/01/2025   Health Maintenance Review LIFESTYLE:  Exercise:   Since September  pilates  Tobacco/ETS: no Alcohol: no Sugar beverages: no Sleep: ok  Drug use: no  ROS:  GEN/ HEENT: No fever, significant weight changes sweats headaches vision problems hearing changes, CV/ PULM; No chest pain shortness of breath cough, syncope,edema  change in exercise tolerance. GI /GU: No adominal pain, vomiting, change in bowel habits. No blood in the stool. No significant GU symptoms. SKIN/HEME: ,no acute skin rashes suspicious lesions or bleeding. No lymphadenopathy, nodules, masses.  NEURO/ PSYCH:  No neurologic signs such as weakness numbness. No depression anxiety. IMM/ Allergy: No unusual infections.  Allergy .   REST of 12 system review negative except as per HPI   Past Medical History  Diagnosis Date  . Hypothyroid     Sp ablation  on meds per Dr Chalmers Cater  . Hematuria     Dr Joelyn Oms 2001 Trigonitis. Ultrasound and cystoscopy  . Hyperlipidemia      Past Surgical History  Procedure Laterality Date  . Lipoma excision      left buttock 3 cm    Family History  Problem Relation Age of Onset  . Diabetes    . Hyperlipidemia    . Heart attack  48    father  . Thyroid disease      mom  . Diabetes Mother     Social History   Social History  . Marital Status: Single    Spouse Name: N/A  . Number of Children: N/A  . Years of Education: N/A   Social History Main Topics  . Smoking status: Former Research scientist (life sciences)  . Smokeless tobacco: None  . Alcohol Use: No  . Drug Use: No  . Sexual Activity: Not Asked   Other Topics Concern  . None   Social History Narrative   hh 1  +   Pet dog  Passed in June 15    Exercise  No etoh.   Sleep  7-8 hours.      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     Outpatient Prescriptions Prior to Visit  Medication Sig Dispense Refill  . CALCIUM CITRATE-VITAMIN D PO Take by mouth. 1000 MG OF Calcium and 500 iu of vitamin D    . COMBIPATCH 0.05-0.14 MG/DAY PLACE 1 PATCH ONTO THE SKIN 2 (TWO) TIMES A WEEK. 24 patch 1  . Krill Oil 300 MG CAPS  Take by mouth.    . levothyroxine (SYNTHROID, LEVOTHROID) 100 MCG tablet Take 100 mcg by mouth daily before breakfast.    . Multiple Vitamin (MULTIVITAMIN) tablet Take 1 tablet by mouth daily.      . simvastatin (ZOCOR) 20 MG tablet TAKE 1 TABLET AT BEDTIME 90 tablet 2  . estradiol-norethindrone (COMBIPATCH) 0.05-0.14 MG/DAY Place 1 patch onto the skin once a week.     No facility-administered medications prior to visit.     EXAM:  BP 114/76 mmHg  Temp(Src) 98.8 F (37.1 C) (Oral)  Ht 5' 0.25" (1.53 m)  Wt 169 lb (76.658 kg)  BMI 32.75 kg/m2  LMP 05/30/2012  Body mass index is 32.75 kg/(m^2).  Physical Exam: Vital signs reviewed YYQ:MGNO is a well-developed well-nourished alert cooperative    who appearsr stated age in no acute distress.  HEENT: normocephalic atraumatic , Eyes: PERRL EOM's full, conjunctiva clear, Nares: paten,t no deformity  discharge or tenderness., Ears: no deformity EAC's clear TMs with normal landmarks. Mouth: clear OP, no lesions, edema.  Moist mucous membranes. Dentition in adequate repair. NECK: supple without masses, thyroid palp no obv nodule or bruits. Breast: normal by inspection . No dimpling, discharge, masses, tenderness or discharge . CHEST/PULM:  Clear to auscultation and percussion breath sounds equal no wheeze , rales or rhonchi. No chest wall deformities or tenderness. CV: PMI is nondisplaced, S1 S2 no gallops, murmurs, rubs. Peripheral pulses are full without delay.No JVD .  ABDOMEN: Bowel sounds normal nontender  No guard or rebound, no hepato splenomegal no CVA tenderness.  Extremtities:  No clubbing cyanosis or edema, no acute joint swelling or redness no focal atrophy NEURO:  Oriented x3, cranial nerves 3-12 appear to be intact, no obvious focal weakness,gait within normal limits no abnormal reflexes or asymmetrical SKIN: No acute rashes normal turgor, color, no bruising or petechiae. PSYCH: Oriented, good eye contact, no obvious depression anxiety, cognition and judgment appear normal. LN: no cervical axillary inguinal adenopathy Pelvic: NL ext GU, labia clear without lesions or rash . Vagina no lesions .Cervix: clear  UTERUS: Neg CMT Adnexa:  clear no masses . PAP doneHPV cotest  Rectal no mass heme neg  Lab Results  Component Value Date   WBC 9.0 09/26/2015   HGB 13.6 09/26/2015   HCT 41.7 09/26/2015   PLT 261.0 09/26/2015   GLUCOSE 116* 09/26/2015   CHOL 188 09/26/2015   TRIG 146.0 09/26/2015   HDL 43.40 09/26/2015   LDLDIRECT 108.6 01/29/2009   LDLCALC 116* 09/26/2015   ALT 17 09/26/2015   AST 19 09/26/2015   NA 141 09/26/2015   K 4.3 09/26/2015   CL 105 09/26/2015   CREATININE 0.81 09/26/2015   BUN 15 09/26/2015   CO2 26 09/26/2015   TSH 1.35 09/26/2015   Wt Readings from Last 3 Encounters:  10/02/15 169 lb (76.658 kg)  09/19/14 159 lb (72.122 kg)  08/29/13 170 lb  (77.111 kg)    ASSESSMENT AND PLAN:  Discussed the following assessment and plan:  Visit for preventive health examination - Plan: PAP [[place]]  Encounter for routine gynecological examination - pap hpv cotesting ave risk  - Plan: PAP [[place]]  Hypothyroidism, unspecified hypothyroidism type  Hyperlipidemia  Hormone replacement therapy (HRT) - helping can titrate dose  Need for prophylactic vaccination and inoculation against influenza - Plan: Flu Vaccine QUAD 36+ mos PF IM (Fluarix & Fluzone Quad PF)  Need for Tdap vaccination - Plan: Tdap vaccine greater than or equal to 7yo  IM  BMI 32.0-32.9,adult - counseled  Medication management  Hyperglycemia - dsic lsi Pt has hsa can look into nutrition if consider referral  Spend a good bit of time disc strategies possibel  Poss portion sizes etc  Patient Care Team: Burnis Medin, MD as PCP - General Jacelyn Pi, MD (Endocrinology) Milus Banister, MD (Gastroenterology) Patient Instructions   Intensify lifestyle interventions. For now can titrated the hormones to prevent symptoms   Shingles vaccine  Advised at age 35  For reasons discussed .  We can send you to nutritionist  .   To help with  Diet choices.  Make sure  Doing resistance training .     Repeat lipid and blood sugar at about 6 months and then rov to see how you are doing.     Health Maintenance, Female Adopting a healthy lifestyle and getting preventive care can go a long way to promote health and wellness. Talk with your health care provider about what schedule of regular examinations is right for you. This is a good chance for you to check in with your provider about disease prevention and staying healthy. In between checkups, there are plenty of things you can do on your own. Experts have done a lot of research about which lifestyle changes and preventive measures are most likely to keep you healthy. Ask your health care provider for more  information. WEIGHT AND DIET  Eat a healthy diet  Be sure to include plenty of vegetables, fruits, low-fat dairy products, and lean protein.  Do not eat a lot of foods high in solid fats, added sugars, or salt.  Get regular exercise. This is one of the most important things you can do for your health.  Most adults should exercise for at least 150 minutes each week. The exercise should increase your heart rate and make you sweat (moderate-intensity exercise).  Most adults should also do strengthening exercises at least twice a week. This is in addition to the moderate-intensity exercise.  Maintain a healthy weight  Body mass index (BMI) is a measurement that can be used to identify possible weight problems. It estimates body fat based on height and weight. Your health care provider can help determine your BMI and help you achieve or maintain a healthy weight.  For females 40 years of age and older:   A BMI below 18.5 is considered underweight.  A BMI of 18.5 to 24.9 is normal.  A BMI of 25 to 29.9 is considered overweight.  A BMI of 30 and above is considered obese.  Watch levels of cholesterol and blood lipids  You should start having your blood tested for lipids and cholesterol at 55 years of age, then have this test every 5 years.  You may need to have your cholesterol levels checked more often if:  Your lipid or cholesterol levels are high.  You are older than 55 years of age.  You are at high risk for heart disease.  CANCER SCREENING   Lung Cancer  Lung cancer screening is recommended for adults 52-35 years old who are at high risk for lung cancer because of a history of smoking.  A yearly low-dose CT scan of the lungs is recommended for people who:  Currently smoke.  Have quit within the past 15 years.  Have at least a 30-pack-year history of smoking. A pack year is smoking an average of one pack of cigarettes a day for 1 year.  Yearly screening should  continue  until it has been 15 years since you quit.  Yearly screening should stop if you develop a health problem that would prevent you from having lung cancer treatment.  Breast Cancer  Practice breast self-awareness. This means understanding how your breasts normally appear and feel.  It also means doing regular breast self-exams. Let your health care provider know about any changes, no matter how small.  If you are in your 20s or 30s, you should have a clinical breast exam (CBE) by a health care provider every 1-3 years as part of a regular health exam.  If you are 66 or older, have a CBE every year. Also consider having a breast X-ray (mammogram) every year.  If you have a family history of breast cancer, talk to your health care provider about genetic screening.  If you are at high risk for breast cancer, talk to your health care provider about having an MRI and a mammogram every year.  Breast cancer gene (BRCA) assessment is recommended for women who have family members with BRCA-related cancers. BRCA-related cancers include:  Breast.  Ovarian.  Tubal.  Peritoneal cancers.  Results of the assessment will determine the need for genetic counseling and BRCA1 and BRCA2 testing. Cervical Cancer Your health care provider may recommend that you be screened regularly for cancer of the pelvic organs (ovaries, uterus, and vagina). This screening involves a pelvic examination, including checking for microscopic changes to the surface of your cervix (Pap test). You may be encouraged to have this screening done every 3 years, beginning at age 41.  For women ages 14-65, health care providers may recommend pelvic exams and Pap testing every 3 years, or they may recommend the Pap and pelvic exam, combined with testing for human papilloma virus (HPV), every 5 years. Some types of HPV increase your risk of cervical cancer. Testing for HPV may also be done on women of any age with unclear Pap  test results.  Other health care providers may not recommend any screening for nonpregnant women who are considered low risk for pelvic cancer and who do not have symptoms. Ask your health care provider if a screening pelvic exam is right for you.  If you have had past treatment for cervical cancer or a condition that could lead to cancer, you need Pap tests and screening for cancer for at least 20 years after your treatment. If Pap tests have been discontinued, your risk factors (such as having a new sexual partner) need to be reassessed to determine if screening should resume. Some women have medical problems that increase the chance of getting cervical cancer. In these cases, your health care provider may recommend more frequent screening and Pap tests. Colorectal Cancer  This type of cancer can be detected and often prevented.  Routine colorectal cancer screening usually begins at 55 years of age and continues through 55 years of age.  Your health care provider may recommend screening at an earlier age if you have risk factors for colon cancer.  Your health care provider may also recommend using home test kits to check for hidden blood in the stool.  A small camera at the end of a tube can be used to examine your colon directly (sigmoidoscopy or colonoscopy). This is done to check for the earliest forms of colorectal cancer.  Routine screening usually begins at age 80.  Direct examination of the colon should be repeated every 5-10 years through 55 years of age. However, you may need to be screened  more often if early forms of precancerous polyps or small growths are found. Skin Cancer  Check your skin from head to toe regularly.  Tell your health care provider about any new moles or changes in moles, especially if there is a change in a mole's shape or color.  Also tell your health care provider if you have a mole that is larger than the size of a pencil eraser.  Always use sunscreen.  Apply sunscreen liberally and repeatedly throughout the day.  Protect yourself by wearing long sleeves, pants, a wide-brimmed hat, and sunglasses whenever you are outside. HEART DISEASE, DIABETES, AND HIGH BLOOD PRESSURE   High blood pressure causes heart disease and increases the risk of stroke. High blood pressure is more likely to develop in:  People who have blood pressure in the high end of the normal range (130-139/85-89 mm Hg).  People who are overweight or obese.  People who are African American.  If you are 66-89 years of age, have your blood pressure checked every 3-5 years. If you are 44 years of age or older, have your blood pressure checked every year. You should have your blood pressure measured twice--once when you are at a hospital or clinic, and once when you are not at a hospital or clinic. Record the average of the two measurements. To check your blood pressure when you are not at a hospital or clinic, you can use:  An automated blood pressure machine at a pharmacy.  A home blood pressure monitor.  If you are between 79 years and 68 years old, ask your health care provider if you should take aspirin to prevent strokes.  Have regular diabetes screenings. This involves taking a blood sample to check your fasting blood sugar level.  If you are at a normal weight and have a low risk for diabetes, have this test once every three years after 55 years of age.  If you are overweight and have a high risk for diabetes, consider being tested at a younger age or more often. PREVENTING INFECTION  Hepatitis B  If you have a higher risk for hepatitis B, you should be screened for this virus. You are considered at high risk for hepatitis B if:  You were born in a country where hepatitis B is common. Ask your health care provider which countries are considered high risk.  Your parents were born in a high-risk country, and you have not been immunized against hepatitis B (hepatitis B  vaccine).  You have HIV or AIDS.  You use needles to inject street drugs.  You live with someone who has hepatitis B.  You have had sex with someone who has hepatitis B.  You get hemodialysis treatment.  You take certain medicines for conditions, including cancer, organ transplantation, and autoimmune conditions. Hepatitis C  Blood testing is recommended for:  Everyone born from [birth year] through 1966.  Anyone with known risk factors for hepatitis C. Sexually transmitted infections (STIs)  You should be screened for sexually transmitted infections (STIs) including gonorrhea and chlamydia if:  You are sexually active and are younger than 55 years of age.  You are older than 55 years of age and your health care provider tells you that you are at risk for this type of infection.  Your sexual activity has changed since you were last screened and you are at an increased risk for chlamydia or gonorrhea. Ask your health care provider if you are at risk.  If you do not  have HIV, but are at risk, it may be recommended that you take a prescription medicine daily to prevent HIV infection. This is called pre-exposure prophylaxis (PrEP). You are considered at risk if:  You are sexually active and do not regularly use condoms or know the HIV status of your partner(s).  You take drugs by injection.  You are sexually active with a partner who has HIV. Talk with your health care provider about whether you are at high risk of being infected with HIV. If you choose to begin PrEP, you should first be tested for HIV. You should then be tested every 3 months for as long as you are taking PrEP.  PREGNANCY   If you are premenopausal and you may become pregnant, ask your health care provider about preconception counseling.  If you may become pregnant, take 400 to 800 micrograms (mcg) of folic acid every day.  If you want to prevent pregnancy, talk to your health care provider about birth control  (contraception). OSTEOPOROSIS AND MENOPAUSE   Osteoporosis is a disease in which the bones lose minerals and strength with aging. This can result in serious bone fractures. Your risk for osteoporosis can be identified using a bone density scan.  If you are 21 years of age or older, or if you are at risk for osteoporosis and fractures, ask your health care provider if you should be screened.  Ask your health care provider whether you should take a calcium or vitamin D supplement to lower your risk for osteoporosis.  Menopause may have certain physical symptoms and risks.  Hormone replacement therapy may reduce some of these symptoms and risks. Talk to your health care provider about whether hormone replacement therapy is right for you.  HOME CARE INSTRUCTIONS   Schedule regular health, dental, and eye exams.  Stay current with your immunizations.   Do not use any tobacco products including cigarettes, chewing tobacco, or electronic cigarettes.  If you are pregnant, do not drink alcohol.  If you are breastfeeding, limit how much and how often you drink alcohol.  Limit alcohol intake to no more than 1 drink per day for nonpregnant women. One drink equals 12 ounces of beer, 5 ounces of wine, or 1 ounces of hard liquor.  Do not use street drugs.  Do not share needles.  Ask your health care provider for help if you need support or information about quitting drugs.  Tell your health care provider if you often feel depressed.  Tell your health care provider if you have ever been abused or do not feel safe at home.   This information is not intended to replace advice given to you by your health care provider. Make sure you discuss any questions you have with your health care provider.   Document Released: 06/01/2011 Document Revised: 12/07/2014 Document Reviewed: 10/18/2013 Elsevier Interactive Patient Education [employer].     Why follow it? Research shows. . Those who  follow the Mediterranean diet have a reduced risk of heart disease  . The diet is associated with a reduced incidence of Parkinson's and Alzheimer's diseases . People following the diet may have longer life expectancies and lower rates of chronic diseases  . The Dietary Guidelines for Americans recommends the Mediterranean diet as an eating plan to promote health and prevent disease  What Is the Mediterranean Diet?  . Healthy eating plan based on typical foods and recipes of Mediterranean-style cooking . The diet is primarily a plant based diet; these  foods should make up a majority of meals   Starches - Plant based foods should make up a majority of meals - They are an important sources of vitamins, minerals, energy, antioxidants, and fiber - Choose whole grains, foods high in fiber and minimally processed items  - Typical grain sources include wheat, oats, barley, corn, brown rice, bulgar, farro, millet, polenta, couscous  - Various types of beans include chickpeas, lentils, fava beans, black beans, white beans   Fruits  Veggies - Large quantities of antioxidant rich fruits & veggies; 6 or more servings  - Vegetables can be eaten raw or lightly drizzled with oil and cooked  - Vegetables common to the traditional Mediterranean Diet include: artichokes, arugula, beets, broccoli, brussel sprouts, cabbage, carrots, celery, collard greens, cucumbers, eggplant, kale, leeks, lemons, lettuce, mushrooms, okra, onions, peas, peppers, potatoes, pumpkin, radishes, rutabaga, shallots, spinach, sweet potatoes, turnips, zucchini - Fruits common to the Mediterranean Diet include: apples, apricots, avocados, cherries, clementines, dates, figs, grapefruits, grapes, melons, nectarines, oranges, peaches, pears, pomegranates, strawberries, tangerines  Fats - Replace butter and margarine with healthy oils, such as olive oil, canola oil, and tahini  - Limit nuts to no more than a handful a day  - Nuts include  walnuts, almonds, pecans, pistachios, pine nuts  - Limit or avoid candied, honey roasted or heavily salted nuts - Olives are central to the [employer] - can be eaten whole or used in a variety of dishes   Meats Protein - Limiting red meat: no more than a few times a month - When eating red meat: choose lean cuts and keep the portion to the size of deck of cards - Eggs: approx. 0 to 4 times a week  - Fish and lean poultry: at least 2 a week  - Healthy protein sources include, chicken, [place], lean beef, lamb - Increase intake of seafood such as tuna, salmon, trout, mackerel, shrimp, scallops - Avoid or limit high fat processed meats such as sausage and bacon  Dairy - Include moderate amounts of low fat dairy products  - Focus on healthy dairy such as fat free yogurt, skim milk, low or reduced fat cheese - Limit dairy products higher in fat such as whole or 2% milk, cheese, ice cream  Alcohol - Moderate amounts of red wine is ok  - No more than 5 oz daily for women (all ages) and men older than age 24  - No more than 10 oz of wine daily for men younger than 81  Other - Limit sweets and other desserts  - Use herbs and spices instead of salt to flavor foods  - Herbs and spices common to the traditional Mediterranean Diet include: basil, bay leaves, chives, cloves, cumin, fennel, garlic, lavender, marjoram, mint, oregano, parsley, pepper, rosemary, sage, savory, sumac, tarragon, thyme   It's not just a diet, it's a lifestyle:  . The Mediterranean diet includes lifestyle factors typical of those in the region  . Foods, drinks and meals are best eaten with others and savored . Daily physical activity is important for overall good health . This could be strenuous exercise like running and aerobics . This could also be more leisurely activities such as walking, housework, yard-work, or taking the stairs . Moderation is the key; a balanced and healthy diet accommodates most foods and  drinks . Consider portion sizes and frequency of consumption of certain foods   Meal Ideas & Options:  . Breakfast:  o Whole wheat toast or whole  wheat English muffins with peanut butter & hard boiled egg o Steel cut oats topped with apples & cinnamon and skim milk  o Fresh fruit: banana, strawberries, melon, berries, peaches  o Smoothies: strawberries, bananas, greek yogurt, peanut butter o Low fat greek yogurt with blueberries and granola  o Egg white omelet with spinach and mushrooms o Breakfast couscous: whole wheat couscous, apricots, skim milk, cranberries  . Sandwiches:  o Hummus and grilled vegetables (peppers, zucchini, squash) on whole wheat bread   o Grilled chicken on whole wheat pita with lettuce, tomatoes, cucumbers or tzatziki  o Tuna salad on whole wheat bread: tuna salad made with greek yogurt, olives, red peppers, capers, green onions o Garlic rosemary lamb pita: lamb sauted with garlic, rosemary, salt & pepper; add lettuce, cucumber, greek yogurt to pita - flavor with lemon juice and black pepper  . Seafood:  o Mediterranean grilled salmon, seasoned with garlic, basil, parsley, lemon juice and black pepper o Shrimp, lemon, and spinach whole-grain pasta salad made with low fat greek yogurt  o Seared scallops with lemon orzo  o Seared tuna steaks seasoned salt, pepper, coriander topped with tomato mixture of olives, tomatoes, olive oil, minced garlic, parsley, green onions and cappers  . Meats:  o Herbed greek chicken salad with kalamata olives, cucumber, feta  o Red bell peppers stuffed with spinach, bulgur, lean ground beef (or lentils) & topped with feta   o Kebabs: skewers of chicken, tomatoes, onions, zucchini, squash  o [place] burgers: made with red onions, mint, dill, lemon juice, feta cheese topped with roasted red peppers . Vegetarian o Cucumber salad: cucumbers, artichoke hearts, celery, red onion, feta cheese, tossed in olive oil & lemon juice  o Hummus and  whole grain pita points with a greek salad (lettuce, tomato, feta, olives, cucumbers, red onion) o Lentil soup with celery, carrots made with vegetable broth, garlic, salt and pepper  o Tabouli salad: parsley, bulgur, mint, scallions, cucumbers, tomato, radishes, lemon juice, olive oil, salt and pepper.         Standley Brooking. Tahjay Binion M.D.

## 2015-10-07 LAB — CYTOLOGY - PAP

## 2015-10-07 NOTE — Progress Notes (Signed)
Quick Note:  Tell patient PAP is normal. HPV high risk is negative ______ 

## 2015-11-11 ENCOUNTER — Other Ambulatory Visit: Payer: Self-pay | Admitting: Internal Medicine

## 2015-11-12 NOTE — Telephone Encounter (Signed)
Sent to the pharmacy by e-scribe. 

## 2015-11-13 LAB — HM MAMMOGRAPHY

## 2015-11-15 ENCOUNTER — Encounter: Payer: Self-pay | Admitting: Family Medicine

## 2016-03-30 ENCOUNTER — Other Ambulatory Visit (INDEPENDENT_AMBULATORY_CARE_PROVIDER_SITE_OTHER): Payer: 59

## 2016-03-30 DIAGNOSIS — E119 Type 2 diabetes mellitus without complications: Secondary | ICD-10-CM

## 2016-03-30 DIAGNOSIS — E785 Hyperlipidemia, unspecified: Secondary | ICD-10-CM | POA: Diagnosis not present

## 2016-03-30 DIAGNOSIS — I10 Essential (primary) hypertension: Secondary | ICD-10-CM

## 2016-03-30 LAB — LIPID PANEL
CHOL/HDL RATIO: 4
CHOLESTEROL: 176 mg/dL (ref 0–200)
HDL: 42.6 mg/dL (ref >39.00)
LDL CALC: 108 mg/dL — AB (ref 0–99)
NonHDL: 133.05
Triglycerides: 125 mg/dL (ref 0.0–149.0)
VLDL: 25 mg/dL (ref 0.0–40.0)

## 2016-03-30 LAB — HEMOGLOBIN A1C: HEMOGLOBIN A1C: 6.6 % — AB (ref 4.6–6.5)

## 2016-03-30 LAB — BASIC METABOLIC PANEL
BUN: 15 mg/dL (ref 6–23)
CHLORIDE: 105 meq/L (ref 96–112)
CO2: 27 meq/L (ref 19–32)
CREATININE: 0.81 mg/dL (ref 0.40–1.20)
Calcium: 9.4 mg/dL (ref 8.4–10.5)
GFR: 77.79 mL/min (ref >60.00)
Glucose, Bld: 119 mg/dL — ABNORMAL HIGH (ref 70–99)
POTASSIUM: 3.9 meq/L (ref 3.5–5.1)
Sodium: 138 mEq/L (ref 135–145)

## 2016-04-01 ENCOUNTER — Other Ambulatory Visit: Payer: 59

## 2016-04-07 NOTE — Progress Notes (Signed)
Chief Complaint  Patient presents with  . Follow-up  . Medication Refill    SIMVASTATIN AND SYNTHROID     HPI: Sarah Floyd 55 y.o.  Comes in for fu of a number of issues LIPIDS med  Labs Thyroid med   She has go teen from dr B but not there for a while   Stable  Financial and time burden to go q 3 months BG has been going up and in last months in weight  Feeling unmotivated   friend in hospice liver cancer  stress  Denies specific depression  ROS: See pertinent positives and negatives per HPI.  Past Medical History  Diagnosis Date  . Hypothyroid     Sp ablation  on meds per Dr Chalmers Cater  . Hematuria     Dr Joelyn Oms 2001 Trigonitis. Ultrasound and cystoscopy  . Hyperlipidemia     Family History  Problem Relation Age of Onset  . Diabetes    . Hyperlipidemia    . Heart attack  44    father  . Thyroid disease      mom  . Diabetes Mother     Social History   Social History  . Marital Status: Single    Spouse Name: N/A  . Number of Children: N/A  . Years of Education: N/A   Social History Main Topics  . Smoking status: Former Research scientist (life sciences)  . Smokeless tobacco: None  . Alcohol Use: No  . Drug Use: No  . Sexual Activity: Not Asked   Other Topics Concern  . None   Social History Narrative   hh 1  +   Pet dog  Passed in June 15    Exercise  No etoh.   Sleep  7-8 hours.      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     Outpatient Prescriptions Prior to Visit  Medication Sig Dispense Refill  . CALCIUM CITRATE-VITAMIN D PO Take by mouth. 1000 MG OF Calcium and 500 iu of vitamin D    . COMBIPATCH 0.05-0.14 MG/DAY PLACE 1 PATCH ONTO THE SKIN 2 (TWO) TIMES A WEEK. 24 patch 1  . Krill Oil 300 MG CAPS Take 100 mg by mouth daily.     . Multiple Vitamin (MULTIVITAMIN) tablet Take 1 tablet by mouth daily.      . simvastatin (ZOCOR) 20 MG tablet TAKE 1 TABLET AT BEDTIME 90 tablet 2  . levothyroxine (SYNTHROID, LEVOTHROID) 100 MCG tablet Take 100 mcg by mouth daily before  breakfast.     No facility-administered medications prior to visit.     EXAM:  BP 110/62 mmHg  Pulse 64  Ht 5' 0.25" (1.53 m)  Wt 180 lb 1.6 oz (81.693 kg)  BMI 34.90 kg/m2  SpO2 98%  LMP 05/30/2012  Body mass index is 34.9 kg/(m^2).  GENERAL: vitals reviewed and listed above, alert, oriented, appears well hydrated and in no acute distress HEENT: atraumatic, conjunctiva  clear, no obvious abnormalities on inspection of external nose and ears OP : no lesion edema or exudate  NECK: no obvious masses on inspection palpation  LUNGS: clear to auscultation bilaterally, no wheezes, rales or rhonchi, good air movement.wpabd  no striae abn body hair CV: HRRR, no clubbing cyanosis or  peripheral edema nl cap refill  MS: moves all extremities without noticeable focal  abnormality PSYCH: pleasant and cooperative, no obvious depression or anxiety Lab Results  Component Value Date   WBC 9.0 09/26/2015   HGB 13.6  09/26/2015   HCT 41.7 09/26/2015   PLT 261.0 09/26/2015   GLUCOSE 119* 03/30/2016   CHOL 176 03/30/2016   TRIG 125.0 03/30/2016   HDL 42.60 03/30/2016   LDLDIRECT 108.6 01/29/2009   LDLCALC 108* 03/30/2016   ALT 17 09/26/2015   AST 19 09/26/2015   NA 138 03/30/2016   K 3.9 03/30/2016   CL 105 03/30/2016   CREATININE 0.81 03/30/2016   BUN 15 03/30/2016   CO2 27 03/30/2016   TSH 1.35 09/26/2015   HGBA1C 6.6* 03/30/2016   Wt Readings from Last 3 Encounters:  04/08/16 180 lb 1.6 oz (81.693 kg)  10/02/15 169 lb (76.658 kg)  09/19/14 159 lb (72.122 kg)    ASSESSMENT AND PLAN:  Discussed the following assessment and plan:  Hyperlipidemia  Hyperglycemia - is pre diabetic vs early diabetes  assoc with weight gain from life circumstances declines metformin to day will ILSI and fu   Weight gain  Medication management  Hypothyroidism, unspecified hypothyroidism type - will rx for her med as stable   prev dr Chalmers Cater     Stress ? add metformin pt doesn't want to do  this at this time  And will do close fu and readdress  Weight gain has  Added to metabolic dysfunction has ?s  Told to send in levo th with allow for substitution and she will get synthroid  Weight gain .  Friend in hospice . Low motivations.  Lack of motivation. Disc counseling she declines as not needed  And meds at this time .  Total visit 22mins > 50% spent counseling and coordinating care as indicated in above note and in instructions to patient .     -Patient advised to return or notify health care team  if symptoms worsen ,persist or new concerns arise.  Patient Instructions   Intensify lifestyle interventions. As you are planning   Exercise .  Consideration of adding  metformin in the interim .  Check on diabetes association  Ada   Or  cdc or american heart  association web sites for healthy eating.  And medication information . UPTODATE.[email or web address]  Let us know  If we can help.  Take care of you.    Tracking may help you.   Plan labs  tsh   hga 1c   In about 3 months and then ROV.    Wt Readings from Last 3 Encounters:  04/08/16 180 lb 1.6 oz (81.693 kg)  10/02/15 169 lb (76.658 kg)  09/19/14 159 lb (72.122 kg)       Mariann Laster K. Teaghan Formica M.D.

## 2016-04-08 ENCOUNTER — Encounter: Payer: Self-pay | Admitting: Internal Medicine

## 2016-04-08 ENCOUNTER — Ambulatory Visit (INDEPENDENT_AMBULATORY_CARE_PROVIDER_SITE_OTHER): Payer: 59 | Admitting: Internal Medicine

## 2016-04-08 VITALS — BP 110/62 | HR 64 | Ht 60.25 in | Wt 180.1 lb

## 2016-04-08 DIAGNOSIS — R635 Abnormal weight gain: Secondary | ICD-10-CM

## 2016-04-08 DIAGNOSIS — E785 Hyperlipidemia, unspecified: Secondary | ICD-10-CM | POA: Diagnosis not present

## 2016-04-08 DIAGNOSIS — R739 Hyperglycemia, unspecified: Secondary | ICD-10-CM

## 2016-04-08 DIAGNOSIS — Z79899 Other long term (current) drug therapy: Secondary | ICD-10-CM | POA: Diagnosis not present

## 2016-04-08 DIAGNOSIS — E039 Hypothyroidism, unspecified: Secondary | ICD-10-CM

## 2016-04-08 DIAGNOSIS — Z658 Other specified problems related to psychosocial circumstances: Secondary | ICD-10-CM

## 2016-04-08 DIAGNOSIS — F439 Reaction to severe stress, unspecified: Secondary | ICD-10-CM

## 2016-04-08 MED ORDER — LEVOTHYROXINE SODIUM 100 MCG PO TABS: 100.0000 ug | ORAL_TABLET | Freq: Every day | ORAL | Status: DC

## 2016-04-08 NOTE — Assessment & Plan Note (Signed)
Fraser Din asks for refill hasn't seen dr Chalmers Cater for a year  Issues getting there for now  Time and finfancial .  Will refill medication and plan recheck labs  .   Can fu with dr Chalmers Cater if  Not stable situation for now.  Plans labs in 3 months when due

## 2016-04-08 NOTE — Patient Instructions (Addendum)
Intensify lifestyle interventions. As you are planning   Exercise .  Consideration of adding  metformin in the interim .  Check on diabetes association  Ada   Or  cdc or american heart  association web sites for healthy eating.  And medication information . UPTODATE.[email or web address]  Let us know  If we can help.  Take care of you.    Tracking may help you.   Plan labs  tsh   hga 1c   In about 3 months and then ROV.    Wt Readings from Last 3 Encounters:  04/08/16 180 lb 1.6 oz (81.693 kg)  10/02/15 169 lb (76.658 kg)  09/19/14 159 lb (72.122 kg)

## 2016-05-05 ENCOUNTER — Encounter: Payer: Self-pay | Admitting: Internal Medicine

## 2016-05-05 ENCOUNTER — Other Ambulatory Visit: Payer: Self-pay | Admitting: General Practice

## 2016-05-05 MED ORDER — SIMVASTATIN 20 MG PO TABS: 20.0000 mg | ORAL_TABLET | Freq: Every day | ORAL | Status: DC

## 2016-07-01 ENCOUNTER — Other Ambulatory Visit (INDEPENDENT_AMBULATORY_CARE_PROVIDER_SITE_OTHER): Payer: 59

## 2016-07-01 DIAGNOSIS — E039 Hypothyroidism, unspecified: Secondary | ICD-10-CM | POA: Diagnosis not present

## 2016-07-01 DIAGNOSIS — E119 Type 2 diabetes mellitus without complications: Secondary | ICD-10-CM | POA: Diagnosis not present

## 2016-07-01 LAB — TSH: TSH: 2.8 u[IU]/mL (ref 0.35–4.50)

## 2016-07-01 LAB — HEMOGLOBIN A1C: Hgb A1c MFr Bld: 6.5 % (ref 4.6–6.5)

## 2016-07-07 NOTE — Progress Notes (Signed)
Pre visit review using our clinic review tool, if applicable. No additional management support is needed unless otherwise documented below in the visit note.  Chief Complaint  Patient presents with  . Follow-up    HPI: Sarah YOUNIS 55 y.o.   Fu bg and lsi  For prediabetic state  Declined metformin   Taking thyroid med differently   And  simva later  .   Metamucil  Changing  Intake but hasnt but planned to add  Exercise soon.  Feels better has lost some weight  ROS: See pertinent positives and negatives per HPI.  Past Medical History:  Diagnosis Date  . Hematuria    Dr Joelyn Oms 2001 Trigonitis. Ultrasound and cystoscopy  . Hyperlipidemia   . Hypothyroid    Sp ablation  on meds per Dr Chalmers Cater    Family History  Problem Relation Age of Onset  . Diabetes    . Hyperlipidemia    . Heart attack  40    father  . Thyroid disease      mom  . Diabetes Mother     Social History   Social History  . Marital status: Single    Spouse name: N/A  . Number of children: N/A  . Years of education: N/A   Social History Main Topics  . Smoking status: Former Research scientist (life sciences)  . Smokeless tobacco: Never Used  . Alcohol use No  . Drug use: No  . Sexual activity: Not Asked   Other Topics Concern  . None   Social History Narrative   hh 1  +   Pet dog  Passed in June 15    Exercise  No etoh.   Sleep  7-8 hours.      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     Outpatient Medications Prior to Visit  Medication Sig Dispense Refill  . CALCIUM CITRATE-VITAMIN D PO Take by mouth. 1000 MG OF Calcium and 500 iu of vitamin D    . COMBIPATCH 0.05-0.14 MG/DAY PLACE 1 PATCH ONTO THE SKIN 2 (TWO) TIMES A WEEK. 24 patch 1  . Krill Oil 300 MG CAPS Take 100 mg by mouth daily.     Marland Kitchen levothyroxine (SYNTHROID, LEVOTHROID) 100 MCG tablet Take 1 tablet (100 mcg total) by mouth daily before breakfast. Allow for substitution 90 tablet 1  . Multiple Vitamin (MULTIVITAMIN) tablet Take 1 tablet by mouth  daily.      . simvastatin (ZOCOR) 20 MG tablet Take 1 tablet (20 mg total) by mouth at bedtime. 90 tablet 1   No facility-administered medications prior to visit.      EXAM:  BP 110/72 (BP Location: Right Arm, Patient Position: Sitting, Cuff Size: Normal)   Temp 98.3 F (36.8 C) (Oral)   Wt 173 lb 14.4 oz (78.9 kg)   LMP 05/30/2012   BMI 33.68 kg/m   Body mass index is 33.68 kg/m.  GENERAL: vitals reviewed and listed above, alert, oriented, appears well hydrated and in no acute distress HEENT: atraumatic, conjunctiva  clear, no obvious abnormalities on inspection of external nose and ears  PSYCH: pleasant and cooperative, no obvious depression or anxiety Lab Results  Component Value Date   WBC 9.0 09/26/2015   HGB 13.6 09/26/2015   HCT 41.7 09/26/2015   PLT 261.0 09/26/2015   GLUCOSE 119 (H) 03/30/2016   CHOL 176 03/30/2016   TRIG 125.0 03/30/2016   HDL 42.60 03/30/2016   LDLDIRECT 108.6 01/29/2009   LDLCALC 108 (H) 03/30/2016  ALT 17 09/26/2015   AST 19 09/26/2015   NA 138 03/30/2016   K 3.9 03/30/2016   CL 105 03/30/2016   CREATININE 0.81 03/30/2016   BUN 15 03/30/2016   CO2 27 03/30/2016   TSH 2.80 07/01/2016   HGBA1C 6.5 07/01/2016    Wt Readings from Last 3 Encounters:  07/08/16 173 lb 14.4 oz (78.9 kg)  04/08/16 180 lb 1.6 oz (81.7 kg)  10/02/15 169 lb (76.7 kg)   BP Readings from Last 3 Encounters:  07/08/16 110/72  04/08/16 110/62  10/02/15 114/76    ASSESSMENT AND PLAN:  Discussed the following assessment and plan:  Hyperglycemia - improved  but still 6.5  continue lsi add exercise etc decline other intervnetion strategies discussed  Weight gain - better Total visit 12mins > 50% spent counseling and coordinating care as indicated in above note and in instructions to patient .     -Patient advised to return or notify health care team  if symptoms worsen ,persist or new concerns arise.  Patient Instructions   Continue healthy weight  loss  .  Your a1c is stable and slightly better  . Need to continue .  Working on this.   Ok to   Continue  Exercise and  Diet changes .     CPX  With full labs to include  a1c at the visit.   Wt Readings from Last 3 Encounters:  07/08/16 173 lb 14.4 oz (78.9 kg)  04/08/16 180 lb 1.6 oz (81.7 kg)  10/02/15 169 lb (76.7 kg)       Monicka Cyran K. Dayvin Aber M.D.

## 2016-07-08 ENCOUNTER — Ambulatory Visit (INDEPENDENT_AMBULATORY_CARE_PROVIDER_SITE_OTHER): Payer: 59 | Admitting: Internal Medicine

## 2016-07-08 ENCOUNTER — Encounter: Payer: Self-pay | Admitting: Internal Medicine

## 2016-07-08 VITALS — BP 110/72 | Temp 98.3°F | Wt 173.9 lb

## 2016-07-08 DIAGNOSIS — R635 Abnormal weight gain: Secondary | ICD-10-CM | POA: Diagnosis not present

## 2016-07-08 DIAGNOSIS — R739 Hyperglycemia, unspecified: Secondary | ICD-10-CM

## 2016-07-08 NOTE — Patient Instructions (Addendum)
Continue healthy weight loss  .  Your a1c is stable and slightly better  . Need to continue .  Working on this.   Ok to   Continue  Exercise and  Diet changes .     CPX  With full labs to include  a1c at the visit.   Wt Readings from Last 3 Encounters:  07/08/16 173 lb 14.4 oz (78.9 kg)  04/08/16 180 lb 1.6 oz (81.7 kg)  10/02/15 169 lb (76.7 kg)

## 2016-09-28 ENCOUNTER — Other Ambulatory Visit: Payer: Self-pay | Admitting: Internal Medicine

## 2016-09-30 ENCOUNTER — Other Ambulatory Visit (INDEPENDENT_AMBULATORY_CARE_PROVIDER_SITE_OTHER): Payer: 59

## 2016-09-30 DIAGNOSIS — Z0000 Encounter for general adult medical examination without abnormal findings: Secondary | ICD-10-CM

## 2016-09-30 LAB — LIPID PANEL
CHOLESTEROL: 174 mg/dL (ref 0–200)
HDL: 39.9 mg/dL (ref >39.00)
LDL Cholesterol: 104 mg/dL — ABNORMAL HIGH (ref 0–99)
NONHDL: 134.22
TRIGLYCERIDES: 150 mg/dL — AB (ref 0.0–149.0)
Total CHOL/HDL Ratio: 4
VLDL: 30 mg/dL (ref 0.0–40.0)

## 2016-09-30 LAB — CBC WITH DIFFERENTIAL/PLATELET
BASOS ABS: 0.1 10*3/uL (ref 0.0–0.1)
BASOS PCT: 0.7 % (ref 0.0–3.0)
Eosinophils Absolute: 0.3 10*3/uL (ref 0.0–0.7)
Eosinophils Relative: 3 % (ref 0.0–5.0)
HEMATOCRIT: 39.5 % (ref 36.0–46.0)
Hemoglobin: 13.2 g/dL (ref 12.0–15.0)
LYMPHS ABS: 2.8 10*3/uL (ref 0.7–4.0)
LYMPHS PCT: 32.4 % (ref 12.0–46.0)
MCHC: 33.5 g/dL (ref 30.0–36.0)
MCV: 90.3 fl (ref 78.0–100.0)
MONOS PCT: 6.5 % (ref 3.0–12.0)
Monocytes Absolute: 0.6 10*3/uL (ref 0.1–1.0)
NEUTROS ABS: 4.9 10*3/uL (ref 1.4–7.7)
NEUTROS PCT: 57.4 % (ref 43.0–77.0)
PLATELETS: 258 10*3/uL (ref 150.0–400.0)
RBC: 4.37 Mil/uL (ref 3.87–5.11)
RDW: 14 % (ref 11.5–15.5)
WBC: 8.6 10*3/uL (ref 4.0–10.5)

## 2016-09-30 LAB — BASIC METABOLIC PANEL
BUN: 14 mg/dL (ref 6–23)
CALCIUM: 10 mg/dL (ref 8.4–10.5)
CO2: 30 meq/L (ref 19–32)
CREATININE: 0.8 mg/dL (ref 0.40–1.20)
Chloride: 103 mEq/L (ref 96–112)
GFR: 78.77 mL/min (ref >60.00)
GLUCOSE: 115 mg/dL — AB (ref 70–99)
Potassium: 4.4 mEq/L (ref 3.5–5.1)
Sodium: 141 mEq/L (ref 135–145)

## 2016-09-30 LAB — HEPATIC FUNCTION PANEL
ALBUMIN: 4.4 g/dL (ref 3.5–5.2)
ALK PHOS: 72 U/L (ref 39–117)
ALT: 15 U/L (ref 0–35)
AST: 15 U/L (ref 0–37)
Bilirubin, Direct: 0.1 mg/dL (ref 0.0–0.3)
TOTAL PROTEIN: 6.9 g/dL (ref 6.0–8.3)
Total Bilirubin: 0.4 mg/dL (ref 0.2–1.2)

## 2016-09-30 LAB — TSH: TSH: 1.46 u[IU]/mL (ref 0.35–4.50)

## 2016-09-30 MED ORDER — LEVOTHYROXINE SODIUM 100 MCG PO TABS: 100.0000 ug | ORAL_TABLET | Freq: Every day | ORAL | 0 refills | Status: DC

## 2016-09-30 NOTE — Telephone Encounter (Signed)
Sent to the pharmacy by e-scribe for 90 days.  Pt has appt scheduled for cpx on 10/07/16

## 2016-10-06 NOTE — Progress Notes (Deleted)
No chief complaint on file.   HPI: Patient  Sarah Floyd  55 y.o. comes in today for Preventive Health Care visit   Health Maintenance  Topic Date Due  . PNEUMOCOCCAL POLYSACCHARIDE VACCINE (1) 06/04/1962  . FOOT EXAM  06/04/1970  . OPHTHALMOLOGY EXAM  06/04/1970  . URINE MICROALBUMIN  06/04/1970  . INFLUENZA VACCINE  06/30/2016  . Hepatitis C Screening  07/08/2017 (Originally 1961/07/24)  . HIV Screening  07/08/2017 (Originally 06/05/1975)  . HEMOGLOBIN A1C  01/01/2017  . MAMMOGRAM  11/12/2017  . PAP SMEAR  10/01/2018  . COLONOSCOPY  04/07/2021  . TETANUS/TDAP  10/01/2025   Health Maintenance Review LIFESTYLE:  Exercise:   Tobacco/ETS: Alcohol:  Sugar beverages: Sleep: Drug use: no HH of  Work:    ROS:  GEN/ HEENT: No fever, significant weight changes sweats headaches vision problems hearing changes, CV/ PULM; No chest pain shortness of breath cough, syncope,edema  change in exercise tolerance. GI /GU: No adominal pain, vomiting, change in bowel habits. No blood in the stool. No significant GU symptoms. SKIN/HEME: ,no acute skin rashes suspicious lesions or bleeding. No lymphadenopathy, nodules, masses.  NEURO/ PSYCH:  No neurologic signs such as weakness numbness. No depression anxiety. IMM/ Allergy: No unusual infections.  Allergy .   REST of 12 system review negative except as per HPI   Past Medical History:  Diagnosis Date  . Hematuria    Dr Joelyn Oms 2001 Trigonitis. Ultrasound and cystoscopy  . Hyperlipidemia   . Hypothyroid    Sp ablation  on meds per Dr Chalmers Cater    Past Surgical History:  Procedure Laterality Date  . LIPOMA EXCISION     left buttock 3 cm    Family History  Problem Relation Age of Onset  . Diabetes    . Hyperlipidemia    . Heart attack  30    father  . Thyroid disease      mom  . Diabetes Mother     Social History   Social History  . Marital status: Single    Spouse name: N/A  . Number of children: N/A  . Years of  education: N/A   Social History Main Topics  . Smoking status: Former Research scientist (life sciences)  . Smokeless tobacco: Never Used  . Alcohol use No  . Drug use: No  . Sexual activity: Not on file   Other Topics Concern  . Not on file   Social History Narrative   hh 1  +   Pet dog  Passed in June 15    Exercise  No etoh.   Sleep  7-8 hours.      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     Outpatient Medications Prior to Visit  Medication Sig Dispense Refill  . CALCIUM CITRATE-VITAMIN D PO Take by mouth. 1000 MG OF Calcium and 500 iu of vitamin D    . COMBIPATCH 0.05-0.14 MG/DAY PLACE 1 PATCH ONTO THE SKIN 2 (TWO) TIMES A WEEK. 24 patch 1  . Krill Oil 300 MG CAPS Take 100 mg by mouth daily.     Marland Kitchen levothyroxine (SYNTHROID, LEVOTHROID) 100 MCG tablet Take 1 tablet (100 mcg total) by mouth daily before breakfast. Allow for substitution 90 tablet 1  . levothyroxine (SYNTHROID, LEVOTHROID) 100 MCG tablet Take 1 tablet (100 mcg total) by mouth daily. 90 tablet 0  . Multiple Vitamin (MULTIVITAMIN) tablet Take 1 tablet by mouth daily.      . simvastatin (ZOCOR) 20 MG tablet  Take 1 tablet (20 mg total) by mouth at bedtime. 90 tablet 1  . TURMERIC PO Take by mouth.     No facility-administered medications prior to visit.      EXAM:  LMP 05/30/2012   There is no height or weight on file to calculate BMI.  Physical Exam: Vital signs reviewed [id number] is a well-developed well-nourished alert cooperative    who appearsr stated age in no acute distress.  HEENT: normocephalic atraumatic , Eyes: PERRL EOM's full, conjunctiva clear, Nares: paten,t no deformity discharge or tenderness., Ears: no deformity EAC's clear TMs with normal landmarks. Mouth: clear OP, no lesions, edema.  Moist mucous membranes. Dentition in adequate repair. NECK: supple without masses, thyromegaly or bruits. CHEST/PULM:  Clear to auscultation and percussion breath sounds equal no wheeze , rales or rhonchi. No chest wall  deformities or tenderness. CV: PMI is nondisplaced, S1 S2 no gallops, murmurs, rubs. Peripheral pulses are full without delay.No JVD .  ABDOMEN: Bowel sounds normal nontender  No guard or rebound, no hepato splenomegal no CVA tenderness.  No hernia. Extremtities:  No clubbing cyanosis or edema, no acute joint swelling or redness no focal atrophy NEURO:  Oriented x3, cranial nerves 3-12 appear to be intact, no obvious focal weakness,gait within normal limits no abnormal reflexes or asymmetrical SKIN: No acute rashes normal turgor, color, no bruising or petechiae. PSYCH: Oriented, good eye contact, no obvious depression anxiety, cognition and judgment appear normal. LN: no cervical axillary inguinal adenopathy  Lab Results  Component Value Date   WBC 8.6 09/30/2016   HGB 13.2 09/30/2016   HCT 39.5 09/30/2016   PLT 258.0 09/30/2016   GLUCOSE 115 (H) 09/30/2016   CHOL 174 09/30/2016   TRIG 150.0 (H) 09/30/2016   HDL 39.90 09/30/2016   LDLDIRECT 108.6 01/29/2009   LDLCALC 104 (H) 09/30/2016   ALT 15 09/30/2016   AST 15 09/30/2016   NA 141 09/30/2016   K 4.4 09/30/2016   CL 103 09/30/2016   CREATININE 0.80 09/30/2016   BUN 14 09/30/2016   CO2 30 09/30/2016   TSH 1.46 09/30/2016   HGBA1C 6.5 07/01/2016    ASSESSMENT AND PLAN:  Discussed the following assessment and plan:  Visit for preventive health examination  Encounter for gynecological examination without abnormal finding  Hypothyroidism, unspecified type  Hyperlipidemia, unspecified hyperlipidemia type  Medication management  Fasting hyperglycemia  Patient Care Team: Burnis Medin, MD as PCP - General Jacelyn Pi, MD (Endocrinology) Milus Banister, MD (Gastroenterology) There are no Patient Instructions on file for this visit.  Standley Brooking. Norina Cowper M.D.

## 2016-10-07 ENCOUNTER — Encounter: Payer: 59 | Admitting: Internal Medicine

## 2016-10-14 ENCOUNTER — Other Ambulatory Visit: Payer: Self-pay | Admitting: Internal Medicine

## 2016-10-15 NOTE — Telephone Encounter (Signed)
Sent to the pharmacy by e-scribe for 90 days.  Pt has upcoming cpx on 10/28/16 @ noon.  Pt had to reschedule due to a death in her family.  Needed to get her in within 30 days of her appt so insurance will pay for lab work.

## 2016-10-28 ENCOUNTER — Encounter: Payer: Self-pay | Admitting: Internal Medicine

## 2016-10-28 ENCOUNTER — Ambulatory Visit (INDEPENDENT_AMBULATORY_CARE_PROVIDER_SITE_OTHER): Payer: 59 | Admitting: Internal Medicine

## 2016-10-28 VITALS — BP 124/78 | Temp 98.2°F | Ht 60.0 in | Wt 167.8 lb

## 2016-10-28 DIAGNOSIS — Z23 Encounter for immunization: Secondary | ICD-10-CM | POA: Diagnosis not present

## 2016-10-28 DIAGNOSIS — Z0000 Encounter for general adult medical examination without abnormal findings: Secondary | ICD-10-CM

## 2016-10-28 DIAGNOSIS — Z79899 Other long term (current) drug therapy: Secondary | ICD-10-CM

## 2016-10-28 DIAGNOSIS — E039 Hypothyroidism, unspecified: Secondary | ICD-10-CM

## 2016-10-28 DIAGNOSIS — R739 Hyperglycemia, unspecified: Secondary | ICD-10-CM

## 2016-10-28 DIAGNOSIS — E785 Hyperlipidemia, unspecified: Secondary | ICD-10-CM | POA: Diagnosis not present

## 2016-10-28 DIAGNOSIS — Z634 Disappearance and death of family member: Secondary | ICD-10-CM

## 2016-10-28 LAB — POCT GLYCOSYLATED HEMOGLOBIN (HGB A1C): Hemoglobin A1C: 5.9

## 2016-10-28 NOTE — Patient Instructions (Addendum)
Intensify lifestyle interventions.  As best possible . Hg a1c is good .  Same thyroid dose .   Wt Readings from Last 3 Encounters:  10/28/16 167 lb 12.8 oz (76.1 kg)  07/08/16 173 lb 14.4 oz (78.9 kg)  04/08/16 180 lb 1.6 oz (81.7 kg)   Plan  Repeat lipid and hg a1c in about 6 months and rov   Health Maintenance, Female Introduction Adopting a healthy lifestyle and getting preventive care can go a long way to promote health and wellness. Talk with your health care provider about what schedule of regular examinations is right for you. This is a good chance for you to check in with your provider about disease prevention and staying healthy. In between checkups, there are plenty of things you can do on your own. Experts have done a lot of research about which lifestyle changes and preventive measures are most likely to keep you healthy. Ask your health care provider for more information. Weight and diet Eat a healthy diet  Be sure to include plenty of vegetables, fruits, low-fat dairy products, and lean protein.  Do not eat a lot of foods high in solid fats, added sugars, or salt.  Get regular exercise. This is one of the most important things you can do for your health.  Most adults should exercise for at least 150 minutes each week. The exercise should increase your heart rate and make you sweat (moderate-intensity exercise).  Most adults should also do strengthening exercises at least twice a week. This is in addition to the moderate-intensity exercise. Maintain a healthy weight  Body mass index (BMI) is a measurement that can be used to identify possible weight problems. It estimates body fat based on height and weight. Your health care provider can help determine your BMI and help you achieve or maintain a healthy weight.  For females 75 years of age and older:  A BMI below 18.5 is considered underweight.  A BMI of 18.5 to 24.9 is normal.  A BMI of 25 to 29.9 is considered  overweight.  A BMI of 30 and above is considered obese. Watch levels of cholesterol and blood lipids  You should start having your blood tested for lipids and cholesterol at 56 years of age, then have this test every 5 years.  You may need to have your cholesterol levels checked more often if:  Your lipid or cholesterol levels are high.  You are older than 56 years of age.  You are at high risk for heart disease. Cancer screening Lung Cancer  Lung cancer screening is recommended for adults 2-53 years old who are at high risk for lung cancer because of a history of smoking.  A yearly low-dose CT scan of the lungs is recommended for people who:  Currently smoke.  Have quit within the past 15 years.  Have at least a 30-pack-year history of smoking. A pack year is smoking an average of one pack of cigarettes a day for 1 year.  Yearly screening should continue until it has been 15 years since you quit.  Yearly screening should stop if you develop a health problem that would prevent you from having lung cancer treatment. Breast Cancer  Practice breast self-awareness. This means understanding how your breasts normally appear and feel.  It also means doing regular breast self-exams. Let your health care provider know about any changes, no matter how small.  If you are in your 20s or 30s, you should have a clinical breast  exam (CBE) by a health care provider every 1-3 years as part of a regular health exam.  If you are 79 or older, have a CBE every year. Also consider having a breast X-ray (mammogram) every year.  If you have a family history of breast cancer, talk to your health care provider about genetic screening.  If you are at high risk for breast cancer, talk to your health care provider about having an MRI and a mammogram every year.  Breast cancer gene (BRCA) assessment is recommended for women who have family members with BRCA-related cancers. BRCA-related cancers  include:  Breast.  Ovarian.  Tubal.  Peritoneal cancers.  Results of the assessment will determine the need for genetic counseling and BRCA1 and BRCA2 testing. Cervical Cancer  Your health care provider may recommend that you be screened regularly for cancer of the pelvic organs (ovaries, uterus, and vagina). This screening involves a pelvic examination, including checking for microscopic changes to the surface of your cervix (Pap test). You may be encouraged to have this screening done every 3 years, beginning at age 70.  For women ages 85-65, health care providers may recommend pelvic exams and Pap testing every 3 years, or they may recommend the Pap and pelvic exam, combined with testing for human papilloma virus (HPV), every 5 years. Some types of HPV increase your risk of cervical cancer. Testing for HPV may also be done on women of any age with unclear Pap test results.  Other health care providers may not recommend any screening for nonpregnant women who are considered low risk for pelvic cancer and who do not have symptoms. Ask your health care provider if a screening pelvic exam is right for you.  If you have had past treatment for cervical cancer or a condition that could lead to cancer, you need Pap tests and screening for cancer for at least 20 years after your treatment. If Pap tests have been discontinued, your risk factors (such as having a new sexual partner) need to be reassessed to determine if screening should resume. Some women have medical problems that increase the chance of getting cervical cancer. In these cases, your health care provider may recommend more frequent screening and Pap tests. Colorectal Cancer  This type of cancer can be detected and often prevented.  Routine colorectal cancer screening usually begins at 56 years of age and continues through 56 years of age.  Your health care provider may recommend screening at an earlier age if you have risk factors  for colon cancer.  Your health care provider may also recommend using home test kits to check for hidden blood in the stool.  A small camera at the end of a tube can be used to examine your colon directly (sigmoidoscopy or colonoscopy). This is done to check for the earliest forms of colorectal cancer.  Routine screening usually begins at age 68.  Direct examination of the colon should be repeated every 5-10 years through 56 years of age. However, you may need to be screened more often if early forms of precancerous polyps or small growths are found. Skin Cancer  Check your skin from head to toe regularly.  Tell your health care provider about any new moles or changes in moles, especially if there is a change in a mole's shape or color.  Also tell your health care provider if you have a mole that is larger than the size of a pencil eraser.  Always use sunscreen. Apply sunscreen liberally and  repeatedly throughout the day.  Protect yourself by wearing long sleeves, pants, a wide-brimmed hat, and sunglasses whenever you are outside. Heart disease, diabetes, and high blood pressure  High blood pressure causes heart disease and increases the risk of stroke. High blood pressure is more likely to develop in:  People who have blood pressure in the high end of the normal range (130-139/85-89 mm Hg).  People who are overweight or obese.  People who are African American.  If you are 31-9 years of age, have your blood pressure checked every 3-5 years. If you are 13 years of age or older, have your blood pressure checked every year. You should have your blood pressure measured twice-once when you are at a hospital or clinic, and once when you are not at a hospital or clinic. Record the average of the two measurements. To check your blood pressure when you are not at a hospital or clinic, you can use:  An automated blood pressure machine at a pharmacy.  A home blood pressure monitor.  If you  are between 19 years and 76 years old, ask your health care provider if you should take aspirin to prevent strokes.  Have regular diabetes screenings. This involves taking a blood sample to check your fasting blood sugar level.  If you are at a normal weight and have a low risk for diabetes, have this test once every three years after 56 years of age.  If you are overweight and have a high risk for diabetes, consider being tested at a younger age or more often. Preventing infection Hepatitis B  If you have a higher risk for hepatitis B, you should be screened for this virus. You are considered at high risk for hepatitis B if:  You were born in a country where hepatitis B is common. Ask your health care provider which countries are considered high risk.  Your parents were born in a high-risk country, and you have not been immunized against hepatitis B (hepatitis B vaccine).  You have HIV or AIDS.  You use needles to inject street drugs.  You live with someone who has hepatitis B.  You have had sex with someone who has hepatitis B.  You get hemodialysis treatment.  You take certain medicines for conditions, including cancer, organ transplantation, and autoimmune conditions. Hepatitis C  Blood testing is recommended for:  Everyone born from [birth year] through 1966.  Anyone with known risk factors for hepatitis C. Sexually transmitted infections (STIs)  You should be screened for sexually transmitted infections (STIs) including gonorrhea and chlamydia if:  You are sexually active and are younger than 56 years of age.  You are older than 56 years of age and your health care provider tells you that you are at risk for this type of infection.  Your sexual activity has changed since you were last screened and you are at an increased risk for chlamydia or gonorrhea. Ask your health care provider if you are at risk.  If you do not have HIV, but are at risk, it may be recommended that you  take a prescription medicine daily to prevent HIV infection. This is called pre-exposure prophylaxis (PrEP). You are considered at risk if:  You are sexually active and do not regularly use condoms or know the HIV status of your partner(s).  You take drugs by injection.  You are sexually active with a partner who has HIV. Talk with your health care provider about whether you are at high risk  of being infected with HIV. If you choose to begin PrEP, you should first be tested for HIV. You should then be tested every 3 months for as long as you are taking PrEP. Pregnancy  If you are premenopausal and you may become pregnant, ask your health care provider about preconception counseling.  If you may become pregnant, take 400 to 800 micrograms (mcg) of folic acid every day.  If you want to prevent pregnancy, talk to your health care provider about birth control (contraception). Osteoporosis and menopause  Osteoporosis is a disease in which the bones lose minerals and strength with aging. This can result in serious bone fractures. Your risk for osteoporosis can be identified using a bone density scan.  If you are 52 years of age or older, or if you are at risk for osteoporosis and fractures, ask your health care provider if you should be screened.  Ask your health care provider whether you should take a calcium or vitamin D supplement to lower your risk for osteoporosis.  Menopause may have certain physical symptoms and risks.  Hormone replacement therapy may reduce some of these symptoms and risks. Talk to your health care provider about whether hormone replacement therapy is right for you. Follow these instructions at home:  Schedule regular health, dental, and eye exams.  Stay current with your immunizations.  Do not use any tobacco products including cigarettes, chewing tobacco, or electronic cigarettes.  If you are pregnant, do not drink alcohol.  If you are breastfeeding, limit  how much and how often you drink alcohol.  Limit alcohol intake to no more than 1 drink per day for nonpregnant women. One drink equals 12 ounces of beer, 5 ounces of wine, or 1 ounces of hard liquor.  Do not use street drugs.  Do not share needles.  Ask your health care provider for help if you need support or information about quitting drugs.  Tell your health care provider if you often feel depressed.  Tell your health care provider if you have ever been abused or do not feel safe at home. This information is not intended to replace advice given to you by your health care provider. Make sure you discuss any questions you have with your health care provider. Document Released: 06/01/2011 Document Revised: 04/23/2016 Document Reviewed: 08/20/2015  2017 [employer]

## 2016-10-28 NOTE — Progress Notes (Signed)
Pre visit review using our clinic review tool, if applicable. No additional management support is needed unless otherwise documented below in the visit note.  Chief Complaint  Patient presents with  . Annual Exam    HPI: Patient  Sarah Floyd  55 y.o. comes in today for Preventive Health Care visit   Partner passed away liver cancer  Mid November  Caretaker doing ok .   Getting back to lsi  Healthy eating has weaned off  hft over past 2 months and seems ok wo hot flushes  No change in thyroid medicine.  Health Maintenance  Topic Date Due  . PNEUMOCOCCAL POLYSACCHARIDE VACCINE (1) 06/04/1962  . FOOT EXAM  06/04/1970  . OPHTHALMOLOGY EXAM  06/04/1970  . URINE MICROALBUMIN  06/04/1970  . Hepatitis C Screening  07/08/2017 (Originally September 02, 1961)  . HIV Screening  07/08/2017 (Originally 06/05/1975)  . HEMOGLOBIN A1C  04/27/2017  . MAMMOGRAM  11/12/2017  . PAP SMEAR  10/01/2018  . COLONOSCOPY  04/07/2021  . TETANUS/TDAP  10/01/2025  . INFLUENZA VACCINE  Completed   Health Maintenance Review LIFESTYLE:  Exercise:  som Tobacco/ETS:n Alcohol: n Sugar beverages:n Sleep:6-8 hours  Drug use: no HH of  1  ROS:  GEN/ HEENT: No fever, significant weight changes sweats headaches vision problems hearing changes, CV/ PULM; No chest pain shortness of breath cough, syncope,edema  change in exercise tolerance. GI /GU: No adominal pain, vomiting, change in bowel habits. No blood in the stool. No significant GU symptoms. SKIN/HEME: ,no acute skin rashes suspicious lesions or bleeding. No lymphadenopathy, nodules, masses.  NEURO/ PSYCH:  No neurologic signs such as weakness numbness. No depression anxiety. IMM/ Allergy: No unusual infections.  Allergy .   REST of 12 system review negative except as per HPI   Past Medical History:  Diagnosis Date  . Hematuria    Dr Joelyn Oms 2001 Trigonitis. Ultrasound and cystoscopy  . Hormone replacement therapy (HRT) 08/30/2013  . Hyperlipidemia   .  Hypothyroid    Sp ablation  on meds per Dr Chalmers Cater    Past Surgical History:  Procedure Laterality Date  . LIPOMA EXCISION     left buttock 3 cm    Family History  Problem Relation Age of Onset  . Diabetes    . Hyperlipidemia    . Heart attack  61    father  . Thyroid disease      mom  . Diabetes Mother     Social History   Social History  . Marital status: Single    Spouse name: N/A  . Number of children: N/A  . Years of education: N/A   Social History Main Topics  . Smoking status: Former Research scientist (life sciences)  . Smokeless tobacco: Never Used  . Alcohol use No  . Drug use: No  . Sexual activity: Not Asked   Other Topics Concern  . None   Social History Narrative   hh 1  +   Pet dog  Passed in June 15    Exercise  No etoh.   Sleep  7-8 hours.      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 CALCIUM CITRATE-VITAMIN D PO Take by mouth. 1000 MG OF Calcium and 500 iu of vitamin D    . Krill Oil 300 MG CAPS Take 100 mg by mouth daily.     Marland Kitchen  levothyroxine (SYNTHROID, LEVOTHROID) 100 MCG tablet Take 1 tablet (100 mcg total) by mouth daily. 90 tablet 0  . Multiple Vitamin (MULTIVITAMIN) tablet Take 1 tablet by mouth daily.      . simvastatin (ZOCOR) 20 MG tablet TAKE 1 TABLET AT BEDTIME 90 tablet 0  . TURMERIC PO Take by mouth.    . COMBIPATCH 0.05-0.14 MG/DAY PLACE 1 PATCH ONTO THE SKIN 2 (TWO) TIMES A WEEK. 24 patch 1  . levothyroxine (SYNTHROID, LEVOTHROID) 100 MCG tablet Take 1 tablet (100 mcg total) by mouth daily before breakfast. Allow for substitution 90 tablet 1   No facility-administered medications prior to visit.      EXAM:  BP 124/78 (BP Location: Right Arm, Patient Position: Sitting, Cuff Size: Normal)   Temp 98.2 F (36.8 C) (Oral)   Ht 5' (1.524 m)   Wt 167 lb 12.8 oz (76.1 kg)   LMP 05/30/2012   BMI 32.77 kg/m   Body mass index is  32.77 kg/m.  Physical Exam: Vital signs reviewed CZY:SAYT is a well-developed well-nourished alert cooperative    who appearsr stated age in no acute distress.  HEENT: normocephalic atraumatic , Eyes: PERRL EOM's full, conjunctiva clear, Nares: paten,t no deformity discharge or tenderness., Ears: no deformity EAC's clear TMs with normal landmarks. Mouth: clear OP, no lesions, edema.  Moist mucous membranes. Dentition in adequate repair. NECK: supple without masses, thyromegaly or bruits. CHEST/PULM:  Clear to auscultation and percussion breath sounds equal no wheeze , rales or rhonchi. No chest wall deformities or tenderness.Breast: normal by inspection . No dimpling, discharge, masses, tenderness or discharge . CV: PMI is nondisplaced, S1 S2 no gallops, murmurs, rubs. Peripheral pulses are full without delay.No JVD .  ABDOMEN: Bowel sounds normal nontender  No guard or rebound, no hepato splenomegal no CVA tenderness.  No hernia. Extremtities:  No clubbing cyanosis or edema, no acute joint swelling or redness no focal atrophy NEURO:  Oriented x3, cranial nerves 3-12 appear to be intact, no obvious focal weakness,gait within normal limits no abnormal reflexes or asymmetrical SKIN: No acute rashes normal turgor, color, no bruising or petechiae. PSYCH: Oriented, good eye contact, no obvious depression anxiety, cognition and judgment appear normal. LN: no cervical axillary inguinal adenopathy  Lab Results  Component Value Date   WBC 8.6 09/30/2016   HGB 13.2 09/30/2016   HCT 39.5 09/30/2016   PLT 258.0 09/30/2016   GLUCOSE 115 (H) 09/30/2016   CHOL 174 09/30/2016   TRIG 150.0 (H) 09/30/2016   HDL 39.90 09/30/2016   LDLDIRECT 108.6 01/29/2009   LDLCALC 104 (H) 09/30/2016   ALT 15 09/30/2016   AST 15 09/30/2016   NA 141 09/30/2016   K 4.4 09/30/2016   CL 103 09/30/2016   CREATININE 0.80 09/30/2016   BUN 14 09/30/2016   CO2 30 09/30/2016   TSH 1.46 09/30/2016   HGBA1C 5.9 10/28/2016      BP Readings from Last 3 Encounters:  10/28/16 124/78  07/08/16 110/72  04/08/16 110/62   Wt Readings from Last 3 Encounters:  10/28/16 167 lb 12.8 oz (76.1 kg)  07/08/16 173 lb 14.4 oz (78.9 kg)  04/08/16 180 lb 1.6 oz (81.7 kg)    ASSESSMENT AND PLAN:  Discussed the following assessment and plan:  Visit for preventive health examination  Hypothyroidism, unspecified type  Hyperlipidemia, unspecified hyperlipidemia type  Medication management  Hyperglycemia - Plan: POCT A1C  Need for prophylactic vaccination and inoculation against influenza - Plan: Flu Vaccine QUAD 36+ mos PF  IM (Fluarix & Fluzone Quad PF)  Recent bereavement Appears to be doing well considering recent bereavement. To continue with lifestyle A1c is good at this time. Triglycerides were up and we do need a follow-up in 6 months. Continue healthy weight loss. Condolences on Loss  Discussed risk-benefit of HRT was weaning down agree with stopping.  Patient Care Team: Madelin Headings, MD as PCP - General Dorisann Frames, MD (Endocrinology) Rachael Fee, MD (Gastroenterology) Patient Instructions   Intensify lifestyle interventions.  As best possible . Hg a1c is good .  Same thyroid dose .   Wt Readings from Last 3 Encounters:  10/28/16 167 lb 12.8 oz (76.1 kg)  07/08/16 173 lb 14.4 oz (78.9 kg)  04/08/16 180 lb 1.6 oz (81.7 kg)   Plan  Repeat lipid and hg a1c in about 6 months and rov   Health Maintenance, Female Introduction Adopting a healthy lifestyle and getting preventive care can go a long way to promote health and wellness. Talk with your health care provider about what schedule of regular examinations is right for you. This is a good chance for you to check in with your provider about disease prevention and staying healthy. In between checkups, there are plenty of things you can do on your own. Experts have done a lot of research about which lifestyle changes and preventive measures are  most likely to keep you healthy. Ask your health care provider for more information. Weight and diet Eat a healthy diet  Be sure to include plenty of vegetables, fruits, low-fat dairy products, and lean protein.  Do not eat a lot of foods high in solid fats, added sugars, or salt.  Get regular exercise. This is one of the most important things you can do for your health.  Most adults should exercise for at least 150 minutes each week. The exercise should increase your heart rate and make you sweat (moderate-intensity exercise).  Most adults should also do strengthening exercises at least twice a week. This is in addition to the moderate-intensity exercise. Maintain a healthy weight  Body mass index (BMI) is a measurement that can be used to identify possible weight problems. It estimates body fat based on height and weight. Your health care provider can help determine your BMI and help you achieve or maintain a healthy weight.  For females 61 years of age and older:  A BMI below 18.5 is considered underweight.  A BMI of 18.5 to 24.9 is normal.  A BMI of 25 to 29.9 is considered overweight.  A BMI of 30 and above is considered obese. Watch levels of cholesterol and blood lipids  You should start having your blood tested for lipids and cholesterol at 56 years of age, then have this test every 5 years.  You may need to have your cholesterol levels checked more often if:  Your lipid or cholesterol levels are high.  You are older than 56 years of age.  You are at high risk for heart disease. Cancer screening Lung Cancer  Lung cancer screening is recommended for adults 24-70 years old who are at high risk for lung cancer because of a history of smoking.  A yearly low-dose CT scan of the lungs is recommended for people who:  Currently smoke.  Have quit within the past 15 years.  Have at least a 30-pack-year history of smoking. A pack year is smoking an average of one pack of  cigarettes a day for 1 year.  Yearly screening  should continue until it has been 15 years since you quit.  Yearly screening should stop if you develop a health problem that would prevent you from having lung cancer treatment. Breast Cancer  Practice breast self-awareness. This means understanding how your breasts normally appear and feel.  It also means doing regular breast self-exams. Let your health care provider know about any changes, no matter how small.  If you are in your 20s or 30s, you should have a clinical breast exam (CBE) by a health care provider every 1-3 years as part of a regular health exam.  If you are 2 or older, have a CBE every year. Also consider having a breast X-ray (mammogram) every year.  If you have a family history of breast cancer, talk to your health care provider about genetic screening.  If you are at high risk for breast cancer, talk to your health care provider about having an MRI and a mammogram every year.  Breast cancer gene (BRCA) assessment is recommended for women who have family members with BRCA-related cancers. BRCA-related cancers include:  Breast.  Ovarian.  Tubal.  Peritoneal cancers.  Results of the assessment will determine the need for genetic counseling and BRCA1 and BRCA2 testing. Cervical Cancer  Your health care provider may recommend that you be screened regularly for cancer of the pelvic organs (ovaries, uterus, and vagina). This screening involves a pelvic examination, including checking for microscopic changes to the surface of your cervix (Pap test). You may be encouraged to have this screening done every 3 years, beginning at age 72.  For women ages 60-65, health care providers may recommend pelvic exams and Pap testing every 3 years, or they may recommend the Pap and pelvic exam, combined with testing for human papilloma virus (HPV), every 5 years. Some types of HPV increase your risk of cervical cancer. Testing for HPV  may also be done on women of any age with unclear Pap test results.  Other health care providers may not recommend any screening for nonpregnant women who are considered low risk for pelvic cancer and who do not have symptoms. Ask your health care provider if a screening pelvic exam is right for you.  If you have had past treatment for cervical cancer or a condition that could lead to cancer, you need Pap tests and screening for cancer for at least 20 years after your treatment. If Pap tests have been discontinued, your risk factors (such as having a new sexual partner) need to be reassessed to determine if screening should resume. Some women have medical problems that increase the chance of getting cervical cancer. In these cases, your health care provider may recommend more frequent screening and Pap tests. Colorectal Cancer  This type of cancer can be detected and often prevented.  Routine colorectal cancer screening usually begins at 56 years of age and continues through 56 years of age.  Your health care provider may recommend screening at an earlier age if you have risk factors for colon cancer.  Your health care provider may also recommend using home test kits to check for hidden blood in the stool.  A small camera at the end of a tube can be used to examine your colon directly (sigmoidoscopy or colonoscopy). This is done to check for the earliest forms of colorectal cancer.  Routine screening usually begins at age 79.  Direct examination of the colon should be repeated every 5-10 years through 56 years of age. However, you may need to  be screened more often if early forms of precancerous polyps or small growths are found. Skin Cancer  Check your skin from head to toe regularly.  Tell your health care provider about any new moles or changes in moles, especially if there is a change in a mole's shape or color.  Also tell your health care provider if you have a mole that is larger than  the size of a pencil eraser.  Always use sunscreen. Apply sunscreen liberally and repeatedly throughout the day.  Protect yourself by wearing long sleeves, pants, a wide-brimmed hat, and sunglasses whenever you are outside. Heart disease, diabetes, and high blood pressure  High blood pressure causes heart disease and increases the risk of stroke. High blood pressure is more likely to develop in:  People who have blood pressure in the high end of the normal range (130-139/85-89 mm Hg).  People who are overweight or obese.  People who are African American.  If you are 40-90 years of age, have your blood pressure checked every 3-5 years. If you are 26 years of age or older, have your blood pressure checked every year. You should have your blood pressure measured twice-once when you are at a hospital or clinic, and once when you are not at a hospital or clinic. Record the average of the two measurements. To check your blood pressure when you are not at a hospital or clinic, you can use:  An automated blood pressure machine at a pharmacy.  A home blood pressure monitor.  If you are between 41 years and 101 years old, ask your health care provider if you should take aspirin to prevent strokes.  Have regular diabetes screenings. This involves taking a blood sample to check your fasting blood sugar level.  If you are at a normal weight and have a low risk for diabetes, have this test once every three years after 56 years of age.  If you are overweight and have a high risk for diabetes, consider being tested at a younger age or more often. Preventing infection Hepatitis B  If you have a higher risk for hepatitis B, you should be screened for this virus. You are considered at high risk for hepatitis B if:  You were born in a country where hepatitis B is common. Ask your health care provider which countries are considered high risk.  Your parents were born in a high-risk country, and you have  not been immunized against hepatitis B (hepatitis B vaccine).  You have HIV or AIDS.  You use needles to inject street drugs.  You live with someone who has hepatitis B.  You have had sex with someone who has hepatitis B.  You get hemodialysis treatment.  You take certain medicines for conditions, including cancer, organ transplantation, and autoimmune conditions. Hepatitis C  Blood testing is recommended for:  Everyone born from [birth year] through 1966.  Anyone with known risk factors for hepatitis C. Sexually transmitted infections (STIs)  You should be screened for sexually transmitted infections (STIs) including gonorrhea and chlamydia if:  You are sexually active and are younger than 56 years of age.  You are older than 56 years of age and your health care provider tells you that you are at risk for this type of infection.  Your sexual activity has changed since you were last screened and you are at an increased risk for chlamydia or gonorrhea. Ask your health care provider if you are at risk.  If you do not  have HIV, but are at risk, it may be recommended that you take a prescription medicine daily to prevent HIV infection. This is called pre-exposure prophylaxis (PrEP). You are considered at risk if:  You are sexually active and do not regularly use condoms or know the HIV status of your partner(s).  You take drugs by injection.  You are sexually active with a partner who has HIV. Talk with your health care provider about whether you are at high risk of being infected with HIV. If you choose to begin PrEP, you should first be tested for HIV. You should then be tested every 3 months for as long as you are taking PrEP. Pregnancy  If you are premenopausal and you may become pregnant, ask your health care provider about preconception counseling.  If you may become pregnant, take 400 to 800 micrograms (mcg) of folic acid every day.  If you want to prevent pregnancy, talk to  your health care provider about birth control (contraception). Osteoporosis and menopause  Osteoporosis is a disease in which the bones lose minerals and strength with aging. This can result in serious bone fractures. Your risk for osteoporosis can be identified using a bone density scan.  If you are 3 years of age or older, or if you are at risk for osteoporosis and fractures, ask your health care provider if you should be screened.  Ask your health care provider whether you should take a calcium or vitamin D supplement to lower your risk for osteoporosis.  Menopause may have certain physical symptoms and risks.  Hormone replacement therapy may reduce some of these symptoms and risks. Talk to your health care provider about whether hormone replacement therapy is right for you. Follow these instructions at home:  Schedule regular health, dental, and eye exams.  Stay current with your immunizations.  Do not use any tobacco products including cigarettes, chewing tobacco, or electronic cigarettes.  If you are pregnant, do not drink alcohol.  If you are breastfeeding, limit how much and how often you drink alcohol.  Limit alcohol intake to no more than 1 drink per day for nonpregnant women. One drink equals 12 ounces of beer, 5 ounces of wine, or 1 ounces of hard liquor.  Do not use street drugs.  Do not share needles.  Ask your health care provider for help if you need support or information about quitting drugs.  Tell your health care provider if you often feel depressed.  Tell your health care provider if you have ever been abused or do not feel safe at home. This information is not intended to replace advice given to you by your health care provider. Make sure you discuss any questions you have with your health care provider. Document Released: 06/01/2011 Document Revised: 04/23/2016 Document Reviewed: 08/20/2015  2017 Elsevier     Mariann Laster K. Tasnia Spegal M.D.

## 2016-10-30 ENCOUNTER — Encounter: Payer: Self-pay | Admitting: Internal Medicine

## 2016-11-03 ENCOUNTER — Encounter: Payer: Self-pay | Admitting: Internal Medicine

## 2016-11-03 DIAGNOSIS — E0590 Thyrotoxicosis, unspecified without thyrotoxic crisis or storm: Secondary | ICD-10-CM

## 2016-11-03 NOTE — Telephone Encounter (Signed)
Misty please order dexa at [facility]  For thyroid disease   And tell patient when done

## 2016-11-17 ENCOUNTER — Encounter: Payer: Self-pay | Admitting: Internal Medicine

## 2016-11-17 LAB — HM MAMMOGRAPHY

## 2016-11-24 ENCOUNTER — Encounter: Payer: Self-pay | Admitting: Family Medicine

## 2016-12-01 ENCOUNTER — Encounter: Payer: Self-pay | Admitting: Internal Medicine

## 2016-12-01 ENCOUNTER — Telehealth: Payer: Self-pay | Admitting: Family Medicine

## 2016-12-01 NOTE — Telephone Encounter (Signed)
Pt notified of results by telephone.  Pt did not need a copy at this time.

## 2016-12-01 NOTE — Telephone Encounter (Signed)
Pt is returning misty call °

## 2016-12-01 NOTE — Telephone Encounter (Signed)
Spoke to the pt and informed her of bone density results.  See telephone note.  No further action needed.

## 2016-12-01 NOTE — Telephone Encounter (Signed)
Per WP, bone density results show mild osteopenia.  Repeat in 3-5 years..  Left a message on home and cell for a return call.  Will need to give pt results and see if she wants a copy sent to her by mail.

## 2016-12-27 ENCOUNTER — Other Ambulatory Visit: Payer: Self-pay | Admitting: Internal Medicine

## 2016-12-31 NOTE — Telephone Encounter (Signed)
Sent to the pharmacy by e-scribe. 

## 2017-01-05 DIAGNOSIS — L918 Other hypertrophic disorders of the skin: Secondary | ICD-10-CM | POA: Diagnosis not present

## 2017-02-19 ENCOUNTER — Ambulatory Visit (INDEPENDENT_AMBULATORY_CARE_PROVIDER_SITE_OTHER): Payer: 59 | Admitting: Internal Medicine

## 2017-02-19 ENCOUNTER — Encounter: Payer: Self-pay | Admitting: Internal Medicine

## 2017-02-19 VITALS — BP 128/76 | HR 76 | Temp 98.3°F | Ht 60.0 in | Wt 165.4 lb

## 2017-02-19 DIAGNOSIS — J069 Acute upper respiratory infection, unspecified: Secondary | ICD-10-CM

## 2017-02-19 DIAGNOSIS — B9789 Other viral agents as the cause of diseases classified elsewhere: Secondary | ICD-10-CM

## 2017-02-19 NOTE — Patient Instructions (Signed)
This is a viral respiratory infection  And should run its course lungexam is normal .   Can take decongestant   Pre air travel.  And possible afrin nose spray   No ear infection.  Contact med team  if fever returns  Or sever pain   Shortness of breath .   Etc  May take 2 weeks to get rid of cough . Hot tea and honey may work as well.

## 2017-02-19 NOTE — Progress Notes (Signed)
Chief Complaint  Patient presents with  . Sore Throat    started monday   . Cough    popping in ears/ nyquil wed and last night     HPI: Sarah Floyd 56 y.o.  sda   4-5 days ago  Sandpaper throat  After rx with claritin.    Watery eyes runny nose   And claritin  nyquil and daquil and cough drops  Had fever 100.6 at onset  Not now  Ears popping supposed to travel [place] on Monday 3 days  For work  No nvd  No sever pain  ' Stayed home from work on mon tues 2 days onset   No current fever chills     ROS: See pertinent positives and negatives per HPI.  Past Medical History:  Diagnosis Date  . Hematuria    Dr Joelyn Oms 2001 Trigonitis. Ultrasound and cystoscopy  . Hormone replacement therapy (HRT) 08/30/2013  . Hyperlipidemia   . Hypothyroid    Sp ablation  on meds per Dr Chalmers Cater    Family History  Problem Relation Age of Onset  . Diabetes    . Hyperlipidemia    . Heart attack  72    father  . Thyroid disease      mom  . Diabetes Mother     Social History   Social History  . Marital status: Single    Spouse name: N/A  . Number of children: N/A  . Years of education: N/A   Social History Main Topics  . Smoking status: Former Research scientist (life sciences)  . Smokeless tobacco: Never Used  . Alcohol use No  . Drug use: No  . Sexual activity: Not Asked   Other Topics Concern  . None   Social History Narrative   hh 1  +   Pet dog  Passed in June 15    Exercise  No etoh.   Sleep  7-8 hours.      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 CALCIUM CITRATE-VITAMIN D PO Take by mouth. 1000 MG OF Calcium and 500 iu of vitamin D    . Krill Oil 300 MG CAPS Take 100 mg by mouth daily.     . Multiple Vitamin (MULTIVITAMIN) tablet Take 1 tablet by mouth daily.      . Psyllium (METAMUCIL PO) 1 TSP twice daily    . simvastatin (ZOCOR) 20 MG tablet TAKE 1 TABLET AT BEDTIME  90 tablet 1  . SYNTHROID 100 MCG tablet TAKE 1 TABLET DAILY 90 tablet 1  . TURMERIC PO Take by mouth.    . Turmeric POWD by Does not apply route. 1 scoop daily    . milk thistle 175 MG tablet Take 175 mg by mouth daily.     No facility-administered medications prior to visit.      EXAM:  BP 128/76 (BP Location: Right Arm, Patient Position: Sitting, Cuff Size: Normal)   Pulse 76   Temp 98.3 F (36.8 C) (Oral)   Ht 5' (1.524 m)   Wt 165 lb 6.4 oz (75 kg)   LMP 05/30/2012   BMI 32.30 kg/m   Body mass index is 32.3 kg/m. WDWN in NAD  quiet respirations; mildly congested  somewhat hoarse. Non toxic . HEENT: Normocephalic ;atraumatic , Eyes;  PERRL, EOMs  Full, lids and conjunctiva clear,,Ears:  no deformities, canals nl, TM landmarks normal, Nose: no deformity or discharge but congested;face non  tender Mouth : OP clear without lesion or edema . Neck: Supple without adenopathy or masses or bruits Chest:  Clear to Awithout wheezes rales or rhonchi CV:  S1-S2 no gallops oshort sem lusb non radiation peripheral perfusion is normal Skin :nl perfusion and no acute rashes    ASSESSMENT AND PLAN:  Discussed the following assessment and plan:  Viral upper respiratory tract infection with cough   Expectant management. And sx  eustachian tube dysfunction  Has under lying allergy but this is a viral infection .  Fu with alarm sx as discussed  -Patient advised to return or notify health care team  if symptoms worsen ,persist or new concerns arise.  Patient Instructions  This is a viral respiratory infection  And should run its course lungexam is normal .   Can take decongestant   Pre air travel.  And possible afrin nose spray   No ear infection.  Contact med team  if fever returns  Or sever pain   Shortness of breath .   Etc  May take 2 weeks to get rid of cough . Hot tea and honey may work as well.     Standley Brooking. Teddi Badalamenti M.D.

## 2017-03-23 DIAGNOSIS — L918 Other hypertrophic disorders of the skin: Secondary | ICD-10-CM | POA: Diagnosis not present

## 2017-03-23 DIAGNOSIS — L821 Other seborrheic keratosis: Secondary | ICD-10-CM | POA: Diagnosis not present

## 2017-03-23 DIAGNOSIS — D1801 Hemangioma of skin and subcutaneous tissue: Secondary | ICD-10-CM | POA: Diagnosis not present

## 2017-04-19 ENCOUNTER — Other Ambulatory Visit (INDEPENDENT_AMBULATORY_CARE_PROVIDER_SITE_OTHER): Payer: 59

## 2017-04-19 DIAGNOSIS — R739 Hyperglycemia, unspecified: Secondary | ICD-10-CM

## 2017-04-19 DIAGNOSIS — E785 Hyperlipidemia, unspecified: Secondary | ICD-10-CM | POA: Diagnosis not present

## 2017-04-19 LAB — LIPID PANEL
CHOL/HDL RATIO: 4
CHOLESTEROL: 170 mg/dL (ref 0–200)
HDL: 38.3 mg/dL — AB (ref >39.00)
LDL CALC: 96 mg/dL (ref 0–99)
NonHDL: 131.63
TRIGLYCERIDES: 176 mg/dL — AB (ref 0.0–149.0)
VLDL: 35.2 mg/dL (ref 0.0–40.0)

## 2017-04-19 LAB — HEMOGLOBIN A1C: Hgb A1c MFr Bld: 6.5 % (ref 4.6–6.5)

## 2017-04-21 ENCOUNTER — Other Ambulatory Visit: Payer: 59

## 2017-04-27 ENCOUNTER — Encounter: Payer: Self-pay | Admitting: Internal Medicine

## 2017-04-27 ENCOUNTER — Ambulatory Visit (INDEPENDENT_AMBULATORY_CARE_PROVIDER_SITE_OTHER): Payer: 59 | Admitting: Internal Medicine

## 2017-04-27 VITALS — BP 110/70 | HR 66 | Temp 97.9°F | Ht 60.0 in | Wt 166.8 lb

## 2017-04-27 DIAGNOSIS — R739 Hyperglycemia, unspecified: Secondary | ICD-10-CM | POA: Diagnosis not present

## 2017-04-27 DIAGNOSIS — Z833 Family history of diabetes mellitus: Secondary | ICD-10-CM | POA: Diagnosis not present

## 2017-04-27 DIAGNOSIS — E785 Hyperlipidemia, unspecified: Secondary | ICD-10-CM

## 2017-04-27 NOTE — Progress Notes (Signed)
Chief Complaint  Patient presents with  . Follow-up    HPI: Sarah Floyd 56 y.o. come in for Chronic disease management  comesin for fu of hyperglycemia and HLD. Has been exercising quite well thought she was doing better having a hard time losing weight but she hasn't gained previous back Lives alone hard to cook for 1. ROS: See pertinent positives and negatives per HPI.  Past Medical History:  Diagnosis Date  . Hematuria    Dr Joelyn Oms 2001 Trigonitis. Ultrasound and cystoscopy  . Hormone replacement therapy (HRT) 08/30/2013  . Hyperlipidemia   . Hypothyroid    Sp ablation  on meds per Dr Chalmers Cater    Family History  Problem Relation Age of Onset  . Diabetes Unknown   . Hyperlipidemia Unknown   . Heart attack Unknown 65       father  . Thyroid disease Unknown        mom  . Diabetes Mother     Social History   Social History  . Marital status: Single    Spouse name: N/A  . Number of children: N/A  . Years of education: N/A   Social History Main Topics  . Smoking status: Former Research scientist (life sciences)  . Smokeless tobacco: Never Used  . Alcohol use No  . Drug use: No  . Sexual activity: Not Asked   Other Topics Concern  . None   Social History Narrative   hh 1  +   Pet dog  Passed in June 15    Exercise  No etoh.   Sleep  7-8 hours.      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 CALCIUM CITRATE-VITAMIN D PO Take by mouth. 1000 MG OF Calcium and 500 iu of vitamin D    . Krill Oil 300 MG CAPS Take 100 mg by mouth daily.     . Multiple Vitamin (MULTIVITAMIN) tablet Take 1 tablet by mouth daily.      . Psyllium (METAMUCIL PO) 1 TSP twice daily    . simvastatin (ZOCOR) 20 MG tablet TAKE 1 TABLET AT BEDTIME 90 tablet 1  . SYNTHROID 100 MCG tablet TAKE 1 TABLET DAILY 90 tablet 1  . Turmeric POWD by Does not apply route. 1 scoop daily    . TURMERIC PO  Take by mouth.     No facility-administered medications prior to visit.      EXAM:  BP 110/70 (BP Location: Right Arm, Patient Position: Sitting, Cuff Size: Normal)   Pulse 66   Temp 97.9 F (36.6 C) (Oral)   Ht 5' (1.524 m)   Wt 166 lb 12.8 oz (75.7 kg)   LMP 05/30/2012   BMI 32.58 kg/m   Body mass index is 32.58 kg/m.  GENERAL: vitals reviewed and listed above, alert, oriented, appears well hydrated and in no acute distress HEENT: atraumatic, conjunctiva  clear, no obvious abnormalities on inspection of external nose and ears PSYCH: pleasant and cooperative, no obvious depression or anxiety Lab Results  Component Value Date   WBC 8.6 09/30/2016   HGB 13.2 09/30/2016   HCT 39.5 09/30/2016   PLT 258.0 09/30/2016   GLUCOSE 115 (H) 09/30/2016   CHOL 170 04/19/2017   TRIG 176.0 (H) 04/19/2017   HDL 38.30 (L) 04/19/2017   LDLDIRECT 108.6 01/29/2009   Tok 96 04/19/2017  ALT 15 09/30/2016   AST 15 09/30/2016   NA 141 09/30/2016   K 4.4 09/30/2016   CL 103 09/30/2016   CREATININE 0.80 09/30/2016   BUN 14 09/30/2016   CO2 30 09/30/2016   TSH 1.46 09/30/2016   HGBA1C 6.5 04/19/2017   BP Readings from Last 3 Encounters:  04/27/17 110/70  02/19/17 128/76  10/28/16 124/78   Wt Readings from Last 3 Encounters:  04/27/17 166 lb 12.8 oz (75.7 kg)  02/19/17 165 lb 6.4 oz (75 kg)  10/28/16 167 lb 12.8 oz (76.1 kg)    ASSESSMENT AND PLAN:  Discussed the following assessment and plan:  Hyperlipidemia, unspecified hyperlipidemia type - low hdl and elev tg   Hyperglycemia  Family history of diabetes mellitus  Can consider  Metformin  Prefers to not take any other medicines She prefers to do intensive tracking that we discussed quite a bit and then contact us for possible nutrition referral. Strategies discussed and plan follow-up metabolic BX in 6 months. ROV 6 month cpx and lab monitoring  Total visit 87mins > 50% spent counseling and coordinating care as  indicated in above note and in instructions to patient .   -Patient advised to return or notify health care team  if  new concerns arise.  Patient Instructions  Tracking intake  Is very important . To be able to  Change it.   We can do a referral to   Nutrition.  After that  Consider .   metformin for additional help .  Sleep attention .  Continue exercise .   Check up  In  6 months .  We can do  Hg a1c in  3-4 months .  If you wish .        Standley Brooking. Panosh M.D.

## 2017-04-27 NOTE — Patient Instructions (Signed)
Tracking intake  Is very important . To be able to  Change it.   We can do a referral to   Nutrition.  After that  Consider .   metformin for additional help .  Sleep attention .  Continue exercise .   Check up  In  6 months .  We can do  Hg a1c in  3-4 months .  If you wish .

## 2017-06-25 ENCOUNTER — Other Ambulatory Visit: Payer: Self-pay | Admitting: Internal Medicine

## 2017-07-18 ENCOUNTER — Other Ambulatory Visit: Payer: Self-pay | Admitting: Internal Medicine

## 2017-08-20 ENCOUNTER — Encounter: Payer: Self-pay | Admitting: Internal Medicine

## 2017-09-23 ENCOUNTER — Other Ambulatory Visit: Payer: Self-pay | Admitting: Internal Medicine

## 2017-09-23 ENCOUNTER — Ambulatory Visit (INDEPENDENT_AMBULATORY_CARE_PROVIDER_SITE_OTHER): Payer: 59 | Admitting: *Deleted

## 2017-09-23 DIAGNOSIS — Z23 Encounter for immunization: Secondary | ICD-10-CM

## 2017-10-29 NOTE — Progress Notes (Signed)
Chief Complaint  Patient presents with  . Annual Exam    Joint soreness and hemorrhoid with rectal bleeding at times    HPI: Patient  Sarah Floyd  56 y.o. comes in today for Preventive Health Care visit  And some other issues    Thyroid :   Hard to get in .to specialsit    None time 2-3  Years . But stable    Taking  20 zocor.  ? Achy  Somewhat mostly in joints      Has some am hand stiffness that resolves in less than an hour  No fever redness new rash   Blood when wipes some times not mixed into stool   lbp :no  bowel or bladder   Associated sx   Health Maintenance  Topic Date Due  . Hepatitis C Screening  01/28/1960  . PNEUMOCOCCAL POLYSACCHARIDE VACCINE (1) 06/04/1962  . FOOT EXAM  06/04/1970  . URINE MICROALBUMIN  06/04/1970  . HIV Screening  06/05/1975  . OPHTHALMOLOGY EXAM  02/28/2018  . HEMOGLOBIN A1C  05/02/2018  . PAP SMEAR  10/01/2018  . MAMMOGRAM  11/17/2018  . COLONOSCOPY  04/07/2021  . TETANUS/TDAP  10/01/2025  . INFLUENZA VACCINE  Completed   Health Maintenance Review LIFESTYLE:  Exercise:    30 min cardio 4 days per week.  Helps   Her well being.  [facility].  Tobacco/ETS:  no Alcohol:    None to rare  Sugar beverages: Sleep: 7   Drug use: no HH of  1 none  Work:  About 45 50    ROS:  GEN/ HEENT: No fever, significant weight changes sweats headaches vision problems hearing changes, CV/ PULM; No chest pain shortness of breath cough, syncope,edema  change in exercise tolerance. GI /GU: No adominal pain, vomiting, change in bowel habits. No blood in the stool. No significant GU symptoms. SKIN/HEME: ,no acute skin rashes suspicious lesions or bleeding. No lymphadenopathy, nodules, masses.  NEURO/ PSYCH:  No neurologic signs such as weakness numbness. No depression anxiety. IMM/ Allergy: No unusual infections.  Allergy .   REST of 12 system review negative except as per HPI   Past Medical History:  Diagnosis Date  . Hematuria    Dr  Joelyn Oms 2001 Trigonitis. Ultrasound and cystoscopy  . Hormone replacement therapy (HRT) 08/30/2013  . Hyperlipidemia   . Hypothyroid    Sp ablation  on meds per Dr Chalmers Cater    Past Surgical History:  Procedure Laterality Date  . LIPOMA EXCISION     left buttock 3 cm    Family History  Problem Relation Age of Onset  . Diabetes Unknown   . Hyperlipidemia Unknown   . Heart attack Unknown 57       father  . Thyroid disease Unknown        mom  . Diabetes Mother     Social History   Socioeconomic History  . Marital status: Single    Spouse name: None  . Number of children: None  . Years of education: None  . Highest education level: None  Social Needs  . Financial resource strain: None  . Food insecurity - worry: None  . Food insecurity - inability: None  . Transportation needs - medical: None  . Transportation needs - non-medical: None  Occupational History  . None  Tobacco Use  . Smoking status: Former Research scientist (life sciences)  . Smokeless tobacco: Never Used  Substance and Sexual Activity  . Alcohol use: No  .  Drug use: No  . Sexual activity: None  Other Topics Concern  . None  Social History Narrative   hh 1  +   Pet dog  Passed in June 15    Exercise  No etoh.   Sleep  7-8 hours.      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 CALCIUM CITRATE-VITAMIN D PO Take by mouth. 1000 MG OF Calcium and 500 iu of vitamin D    . Krill Oil 300 MG CAPS Take 100 mg by mouth daily.     . Multiple Vitamin (MULTIVITAMIN) tablet Take 1 tablet by mouth daily.      . Psyllium (METAMUCIL PO) 1 TSP twice daily    . simvastatin (ZOCOR) 20 MG tablet TAKE 1 TABLET AT BEDTIME 90 tablet 1  . SYNTHROID 100 MCG tablet TAKE 1 TABLET DAILY 90 tablet 0  . Turmeric POWD by Does not apply route. 1 scoop daily     No facility-administered medications prior to visit.      EXAM:  BP  108/80 (BP Location: Left Arm, Patient Position: Sitting, Cuff Size: Normal)   Pulse 72   Temp 97.8 F (36.6 C) (Oral)   Ht 5' (1.524 m)   Wt 162 lb 9.6 oz (73.8 kg)   LMP 05/30/2012   BMI 31.76 kg/m   Body mass index is 31.76 kg/m. Wt Readings from Last 3 Encounters:  11/01/17 162 lb 9.6 oz (73.8 kg)  04/27/17 166 lb 12.8 oz (75.7 kg)  02/19/17 165 lb 6.4 oz (75 kg)    Physical Exam: Vital signs reviewed IEP:PIRJ is a well-developed well-nourished alert cooperative    who appearsr stated age in no acute distress.  HEENT: normocephalic atraumatic , Eyes: PERRL EOM's full, conjunctiva clear, Nares: paten,t no deformity discharge or tenderness., Ears: no deformity EAC's clear TMs with normal landmarks. Mouth: clear OP, no lesions, edema.  Moist mucous membranes. Dentition in adequate repair. NECK: supple without masses,thyroid palpabler bruits. CHEST/PULM:  Clear to auscultation and percussion breath sounds equal no wheeze , rales or rhonchi. No chest wall deformities or tenderness. Breast: normal by inspection . No dimpling, discharge, masses, tenderness or discharge . CV: PMI is nondisplaced, S1 S2 no gallops, murmurs, rubs. Peripheral pulses are full without delay.No JVD .  ABDOMEN: Bowel sounds normal nontender  No guard or rebound, no hepato splenomegal no CVA tenderness.  No hernia. Extremtities:  No clubbing cyanosis or edema, no acute joint swelling or redness no focal atrophy NEURO:  Oriented x3, cranial nerves 3-12 appear to be intact, no obvious focal weakness,gait within normal limits no abnormal reflexes or asymmetrical SKIN: No acute rashes normal turgor, color, no bruising or petechiae. PSYCH: Oriented, good eye contact, no obvious depression anxiety, cognition and judgment appear normal. LN: no cervical axillary inguinal adenopathy Ext  hemm tag no mass  On rectal exam   Lab Results  Component Value Date   WBC 6.6 11/01/2017   HGB 13.6 11/01/2017   HCT 42.4  11/01/2017   PLT 224.0 11/01/2017   GLUCOSE 105 (H) 11/01/2017   CHOL 208 (H) 11/01/2017   TRIG 162.0 (H) 11/01/2017   HDL 49.70 11/01/2017   LDLDIRECT 108.6 01/29/2009   LDLCALC 126 (H) 11/01/2017   ALT 21 11/01/2017   AST 20 11/01/2017   NA 141 11/01/2017   K  4.2 11/01/2017   CL 103 11/01/2017   CREATININE 0.76 11/01/2017   BUN 16 11/01/2017   CO2 31 11/01/2017   TSH 0.57 11/01/2017   HGBA1C 6.4 11/01/2017    BP Readings from Last 3 Encounters:  11/01/17 108/80  04/27/17 110/70  02/19/17 128/76    Lab results reviewed with patient   ASSESSMENT AND PLAN:  Discussed the following assessment and plan:  Visit for preventive health examination - Plan: Basic metabolic panel, CBC with Differential/Platelet, Hemoglobin A1c, Hepatic function panel, Lipid panel, TSH, Sedimentation rate  Hypothyroidism, unspecified type - Plan: Basic metabolic panel, CBC with Differential/Platelet, Hemoglobin A1c, Hepatic function panel, Lipid panel, TSH, Sedimentation rate  Hyperlipidemia, unspecified hyperlipidemia type - Plan: Basic metabolic panel, CBC with Differential/Platelet, Hemoglobin A1c, Hepatic function panel, Lipid panel, TSH, Sedimentation rate  Family history of diabetes mellitus - Plan: Basic metabolic panel, CBC with Differential/Platelet, Hemoglobin A1c, Hepatic function panel, Lipid panel, TSH, Sedimentation rate  Hyperglycemia - Plan: Basic metabolic panel, CBC with Differential/Platelet, Hemoglobin A1c, Hepatic function panel, Lipid panel, TSH, Sedimentation rate  Arthralgia, unspecified joint - Plan: Basic metabolic panel, CBC with Differential/Platelet, Hemoglobin A1c, Hepatic function panel, Lipid panel, TSH, Sedimentation rate, ANA, Cyclic citrul peptide antibody, IgG  Stiffness of finger joint, unspecified laterality - Plan: Basic metabolic panel, CBC with Differential/Platelet, Hemoglobin A1c, Hepatic function panel, Lipid panel, TSH, Sedimentation rate, ANA, Cyclic  citrul peptide antibody, IgG  Anal bleeding  Midline low back pain without sciatica, unspecified chronicity - Plan: Basic metabolic panel, CBC with Differential/Platelet, Hemoglobin A1c, Hepatic function panel, Lipid panel, TSH, Sedimentation rate, ANA, Cyclic citrul peptide antibody, IgG Blood monitoring today  PV Counseled.     Expectant management.  Patient Care Team: Sanvi Ehler, Standley Brooking, MD as PCP - General Jacelyn Pi, MD (Endocrinology) Milus Banister, MD (Gastroenterology) Patient Instructions  Checking thyroid  Sugars   Etc and some arthritis blood tests.   Plan follow up depending on labs   Consideration of seeing rheumatology if hand problems  persistent or progressive  Or screening tests abnormal.   If blood  With tissue  persistent or progressive keeping stools soft then  Fu with dr Ardis Hughs you Gi doc  Your exam today shows no masses's but some old hemorrhoidal tags     Preventive Care 40-64 Years, Female Preventive care refers to lifestyle choices and visits with your health care provider that can promote health and wellness. What does preventive care include?  A yearly physical exam. This is also called an annual well check.  Dental exams once or twice a year.  Routine eye exams. Ask your health care provider how often you should have your eyes checked.  Personal lifestyle choices, including: ? Daily care of your teeth and gums. ? Regular physical activity. ? Eating a healthy diet. ? Avoiding tobacco and drug use. ? Limiting alcohol use. ? Practicing safe sex. ? Taking low-dose aspirin daily starting at age 14. ? Taking vitamin and mineral supplements as recommended by your health care provider. What happens during an annual well check? The services and screenings done by your health care provider during your annual well check will depend on your age, overall health, lifestyle risk factors, and family history of disease. Counseling Your health care provider  may ask you questions about your:  Alcohol use.  Tobacco use.  Drug use.  Emotional well-being.  Home and relationship well-being.  Sexual activity.  Eating habits.  Work and work Statistician.  Method of birth control.  Menstrual cycle.  Pregnancy history.  Screening You may have the following tests or measurements:  Height, weight, and BMI.  Blood pressure.  Lipid and cholesterol levels. These may be checked every 5 years, or more frequently if you are over 75 years old.  Skin check.  Lung cancer screening. You may have this screening every year starting at age 7 if you have a 30-pack-year history of smoking and currently smoke or have quit within the past 15 years.  Fecal occult blood test (FOBT) of the stool. You may have this test every year starting at age 2.  Flexible sigmoidoscopy or colonoscopy. You may have a sigmoidoscopy every 5 years or a colonoscopy every 10 years starting at age 26.  Hepatitis C blood test.  Hepatitis B blood test.  Sexually transmitted disease (STD) testing.  Diabetes screening. This is done by checking your blood sugar (glucose) after you have not eaten for a while (fasting). You may have this done every 1-3 years.  Mammogram. This may be done every 1-2 years. Talk to your health care provider about when you should start having regular mammograms. This may depend on whether you have a family history of breast cancer.  BRCA-related cancer screening. This may be done if you have a family history of breast, ovarian, tubal, or peritoneal cancers.  Pelvic exam and Pap test. This may be done every 3 years starting at age 77. Starting at age 30, this may be done every 5 years if you have a Pap test in combination with an HPV test.  Bone density scan. This is done to screen for osteoporosis. You may have this scan if you are at high risk for osteoporosis.  Discuss your test results, treatment options, and if necessary, the need for  more tests with your health care provider. Vaccines Your health care provider may recommend certain vaccines, such as:  Influenza vaccine. This is recommended every year.  Tetanus, diphtheria, and acellular pertussis (Tdap, Td) vaccine. You may need a Td booster every 10 years.  Varicella vaccine. You may need this if you have not been vaccinated.  Zoster vaccine. You may need this after age 76.  Measles, mumps, and rubella (MMR) vaccine. You may need at least one dose of MMR if you were born in 1957 or later. You may also need a second dose.  Pneumococcal 13-valent conjugate (PCV13) vaccine. You may need this if you have certain conditions and were not previously vaccinated.  Pneumococcal polysaccharide (PPSV23) vaccine. You may need one or two doses if you smoke cigarettes or if you have certain conditions.  Meningococcal vaccine. You may need this if you have certain conditions.  Hepatitis A vaccine. You may need this if you have certain conditions or if you travel or work in places where you may be exposed to hepatitis A.  Hepatitis B vaccine. You may need this if you have certain conditions or if you travel or work in places where you may be exposed to hepatitis B.  Haemophilus influenzae type b (Hib) vaccine. You may need this if you have certain conditions.  Talk to your health care provider about which screenings and vaccines you need and how often you need them. This information is not intended to replace advice given to you by your health care provider. Make sure you discuss any questions you have with your health care provider. Document Released: 12/13/2015 Document Revised: 08/05/2016 Document Reviewed: 09/17/2015 Elsevier Interactive Patient Education  2017 [place]  Type 2 Diabetes Mellitus Type 2 diabetes (type 2 diabetes mellitus) is a long-term (chronic) disease that affects blood sugar (glucose) levels. Normally, a hormone called insulin allows  glucose to enter cells in the body. The cells use glucose for energy. In type 2 diabetes, one or both of these problems may be present:  The body does not make enough insulin.  The body does not respond properly to insulin that it makes (insulin resistance).  Insulin resistance or lack of insulin causes excess glucose to build up in the blood instead of going into cells. As a result, high blood glucose (hyperglycemia) develops, which can cause many complications. Being overweight or obese and having an inactive (sedentary) lifestyle can increase your risk for diabetes. Type 2 diabetes can be delayed or prevented by making certain nutrition and lifestyle changes. What nutrition changes can be made?  Eat healthy meals and snacks regularly. Keep a healthy snack with you for when you get hungry between meals, such as fruit or a handful of nuts.  Eat lean meats and proteins that are low in saturated fats, such as chicken, fish, egg whites, and beans. Avoid processed meats.  Eat plenty of fruits and vegetables and plenty of grains that have not been processed (whole grains). It is recommended that you eat: ? 1?2 cups of fruit every day. ? 2?3 cups of vegetables every day. ? 6?8 oz of whole grains every day, such as oats, whole wheat, bulgur, brown rice, quinoa, and millet.  Eat low-fat dairy products, such as milk, yogurt, and cheese.  Eat foods that contain healthy fats, such as nuts, avocado, olive oil, and canola oil.  Drink water throughout the day. Avoid drinks that contain added sugar, such as soda or sweet tea.  Follow instructions from your health care provider about specific eating or drinking restrictions.  Control how much food you eat at a time (portion size). ? Check food labels to find out the serving sizes of foods. ? Use a kitchen scale to weigh amounts of foods.  Saute or steam food instead of frying it. Cook with water or broth instead of oils or butter.  Limit your  intake of: ? Salt (sodium). Have no more than 1 tsp (2,400 mg) of sodium a day. If you have heart disease or high blood pressure, have less than ? tsp (1,500 mg) of sodium a day. ? Saturated fat. This is fat that is solid at room temperature, such as butter or fat on meat. What lifestyle changes can be made?  Activity  Do moderate-intensity physical activity for at least 30 minutes on at least 5 days of the week, or as much as told by your health care provider.  Ask your health care provider what activities are safe for you. A mix of physical activities may be best, such as walking, swimming, cycling, and strength training.  Try to add physical activity into your day. For example: ? Park in spots that are farther away than usual, so that you walk more. For example, park in a far corner of the parking lot when you go to the office or the grocery store. ? Take a walk during your lunch break. ? Use stairs instead of elevators or escalators. Weight Loss  Lose weight as directed. Your health care provider can determine how much weight loss is best for you and can help you lose weight safely.  If you are overweight or obese, you may be instructed to lose at least 5?7 %  of your body weight. Alcohol and Tobacco   Limit alcohol intake to no more than 1 drink a day for nonpregnant women and 2 drinks a day for men. One drink equals 12 oz of beer, 5 oz of wine, or 1 oz of hard liquor.  Do not use any tobacco products, such as cigarettes, chewing tobacco, and e-cigarettes. If you need help quitting, ask your health care provider. Work With [place] Provider  Have your blood glucose tested regularly, as told by your health care provider.  Discuss your risk factors and how you can reduce your risk for diabetes.  Get screening tests as told by your health care provider. You may have screening tests regularly, especially if you have certain risk factors for type 2 diabetes.  Make an  appointment with a diet and nutrition specialist (registered dietitian). A registered dietitian can help you make a healthy eating plan and can help you understand portion sizes and food labels. Why are these changes important?  It is possible to prevent or delay type 2 diabetes and related health problems by making lifestyle and nutrition changes.  It can be difficult to recognize signs of type 2 diabetes. The best way to avoid possible damage to your body is to take actions to prevent the disease before you develop symptoms. What can happen if changes are not made?  Your blood glucose levels may keep increasing. Having high blood glucose for a long time is dangerous. Too much glucose in your blood can damage your blood vessels, heart, kidneys, nerves, and eyes.  You may develop prediabetes or type 2 diabetes. Type 2 diabetes can lead to many chronic health problems and complications, such as: ? Heart disease. ? Stroke. ? Blindness. ? Kidney disease. ? Depression. ? Poor circulation in the feet and legs, which could lead to surgical removal (amputation) in severe cases. Where to find support:  Ask your health care provider to recommend a registered dietitian, diabetes educator, or weight loss program.  Look for local or online weight loss groups.  Join a gym, fitness club, or outdoor activity group, such as a walking club. Where to find more information: To learn more about diabetes and diabetes prevention, visit:  American Diabetes Association (ADA): www.diabetes.[employer] of Diabetes and Digestive and Kidney Diseases: FindSpin.nl  To learn more about healthy eating, visit:  The U.S. Department of Agriculture Scientist, research (physical sciences)), Choose My Plate: http://wiley-williams.com/  Office of Disease Prevention and Health Promotion (ODPHP), Dietary Guidelines: SurferLive.at  Summary  You can reduce your risk for type 2  diabetes by increasing your physical activity, eating healthy foods, and losing weight as directed.  Talk with your health care provider about your risk for type 2 diabetes. Ask about any blood tests or screening tests that you need to have. This information is not intended to replace advice given to you by your health care provider. Make sure you discuss any questions you have with your health care provider. Document Released: 03/09/2016 Document Revised: 04/23/2016 Document Reviewed: 01/07/2016 Elsevier Interactive Patient Education  2018 [place]. Stedman [place] M.D.

## 2017-11-01 ENCOUNTER — Ambulatory Visit (INDEPENDENT_AMBULATORY_CARE_PROVIDER_SITE_OTHER): Payer: 59 | Admitting: Internal Medicine

## 2017-11-01 ENCOUNTER — Encounter: Payer: Self-pay | Admitting: Internal Medicine

## 2017-11-01 VITALS — BP 108/80 | HR 72 | Temp 97.8°F | Ht 60.0 in | Wt 162.6 lb

## 2017-11-01 DIAGNOSIS — Z833 Family history of diabetes mellitus: Secondary | ICD-10-CM

## 2017-11-01 DIAGNOSIS — K625 Hemorrhage of anus and rectum: Secondary | ICD-10-CM

## 2017-11-01 DIAGNOSIS — Z0001 Encounter for general adult medical examination with abnormal findings: Secondary | ICD-10-CM | POA: Diagnosis not present

## 2017-11-01 DIAGNOSIS — M545 Low back pain, unspecified: Secondary | ICD-10-CM

## 2017-11-01 DIAGNOSIS — M2550 Pain in unspecified joint: Secondary | ICD-10-CM

## 2017-11-01 DIAGNOSIS — R739 Hyperglycemia, unspecified: Secondary | ICD-10-CM

## 2017-11-01 DIAGNOSIS — E039 Hypothyroidism, unspecified: Secondary | ICD-10-CM | POA: Diagnosis not present

## 2017-11-01 DIAGNOSIS — M25649 Stiffness of unspecified hand, not elsewhere classified: Secondary | ICD-10-CM | POA: Diagnosis not present

## 2017-11-01 DIAGNOSIS — Z0000 Encounter for general adult medical examination without abnormal findings: Secondary | ICD-10-CM

## 2017-11-01 DIAGNOSIS — E785 Hyperlipidemia, unspecified: Secondary | ICD-10-CM

## 2017-11-01 LAB — HEPATIC FUNCTION PANEL
ALT: 21 U/L (ref 0–35)
AST: 20 U/L (ref 0–37)
Albumin: 4.6 g/dL (ref 3.5–5.2)
Alkaline Phosphatase: 77 U/L (ref 39–117)
BILIRUBIN DIRECT: 0.1 mg/dL (ref 0.0–0.3)
TOTAL PROTEIN: 7.1 g/dL (ref 6.0–8.3)
Total Bilirubin: 0.5 mg/dL (ref 0.2–1.2)

## 2017-11-01 LAB — BASIC METABOLIC PANEL
BUN: 16 mg/dL (ref 6–23)
CALCIUM: 10.2 mg/dL (ref 8.4–10.5)
CHLORIDE: 103 meq/L (ref 96–112)
CO2: 31 meq/L (ref 19–32)
Creatinine, Ser: 0.76 mg/dL (ref 0.40–1.20)
GFR: 83.25 mL/min (ref >60.00)
Glucose, Bld: 105 mg/dL — ABNORMAL HIGH (ref 70–99)
POTASSIUM: 4.2 meq/L (ref 3.5–5.1)
SODIUM: 141 meq/L (ref 135–145)

## 2017-11-01 LAB — CBC WITH DIFFERENTIAL/PLATELET
BASOS ABS: 0.1 10*3/uL (ref 0.0–0.1)
BASOS PCT: 1 % (ref 0.0–3.0)
Eosinophils Absolute: 0.2 10*3/uL (ref 0.0–0.7)
Eosinophils Relative: 3.2 % (ref 0.0–5.0)
HEMATOCRIT: 42.4 % (ref 36.0–46.0)
Hemoglobin: 13.6 g/dL (ref 12.0–15.0)
LYMPHS PCT: 38.2 % (ref 12.0–46.0)
Lymphs Abs: 2.5 10*3/uL (ref 0.7–4.0)
MCHC: 32.1 g/dL (ref 30.0–36.0)
MCV: 92.7 fl (ref 78.0–100.0)
MONOS PCT: 6.2 % (ref 3.0–12.0)
Monocytes Absolute: 0.4 10*3/uL (ref 0.1–1.0)
NEUTROS ABS: 3.4 10*3/uL (ref 1.4–7.7)
Neutrophils Relative %: 51.4 % (ref 43.0–77.0)
PLATELETS: 224 10*3/uL (ref 150.0–400.0)
RBC: 4.58 Mil/uL (ref 3.87–5.11)
RDW: 13.8 % (ref 11.5–15.5)
WBC: 6.6 10*3/uL (ref 4.0–10.5)

## 2017-11-01 LAB — LIPID PANEL
CHOL/HDL RATIO: 4
Cholesterol: 208 mg/dL — ABNORMAL HIGH (ref 0–200)
HDL: 49.7 mg/dL (ref >39.00)
LDL CALC: 126 mg/dL — AB (ref 0–99)
NonHDL: 158.32
Triglycerides: 162 mg/dL — ABNORMAL HIGH (ref 0.0–149.0)
VLDL: 32.4 mg/dL (ref 0.0–40.0)

## 2017-11-01 LAB — SEDIMENTATION RATE: SED RATE: 8 mm/h (ref 0–30)

## 2017-11-01 LAB — HEMOGLOBIN A1C: Hgb A1c MFr Bld: 6.4 % (ref 4.6–6.5)

## 2017-11-01 LAB — TSH: TSH: 0.57 u[IU]/mL (ref 0.35–4.50)

## 2017-11-01 NOTE — Patient Instructions (Addendum)
Checking thyroid  Sugars   Etc and some arthritis blood tests.   Plan follow up depending on labs   Consideration of seeing rheumatology if hand problems  persistent or progressive  Or screening tests abnormal.   If blood  With tissue  persistent or progressive keeping stools soft then  Fu with dr Ardis Hughs you Gi doc  Your exam today shows no masses's but some old hemorrhoidal tags     Preventive Care 40-64 Years, Female Preventive care refers to lifestyle choices and visits with your health care provider that can promote health and wellness. What does preventive care include?  A yearly physical exam. This is also called an annual well check.  Dental exams once or twice a year.  Routine eye exams. Ask your health care provider how often you should have your eyes checked.  Personal lifestyle choices, including: ? Daily care of your teeth and gums. ? Regular physical activity. ? Eating a healthy diet. ? Avoiding tobacco and drug use. ? Limiting alcohol use. ? Practicing safe sex. ? Taking low-dose aspirin daily starting at age 31. ? Taking vitamin and mineral supplements as recommended by your health care provider. What happens during an annual well check? The services and screenings done by your health care provider during your annual well check will depend on your age, overall health, lifestyle risk factors, and family history of disease. Counseling Your health care provider may ask you questions about your:  Alcohol use.  Tobacco use.  Drug use.  Emotional well-being.  Home and relationship well-being.  Sexual activity.  Eating habits.  Work and work Statistician.  Method of birth control.  Menstrual cycle.  Pregnancy history.  Screening You may have the following tests or measurements:  Height, weight, and BMI.  Blood pressure.  Lipid and cholesterol levels. These may be checked every 5 years, or more frequently if you are over 75 years old.  Skin  check.  Lung cancer screening. You may have this screening every year starting at age 81 if you have a 30-pack-year history of smoking and currently smoke or have quit within the past 15 years.  Fecal occult blood test (FOBT) of the stool. You may have this test every year starting at age 96.  Flexible sigmoidoscopy or colonoscopy. You may have a sigmoidoscopy every 5 years or a colonoscopy every 10 years starting at age 84.  Hepatitis C blood test.  Hepatitis B blood test.  Sexually transmitted disease (STD) testing.  Diabetes screening. This is done by checking your blood sugar (glucose) after you have not eaten for a while (fasting). You may have this done every 1-3 years.  Mammogram. This may be done every 1-2 years. Talk to your health care provider about when you should start having regular mammograms. This may depend on whether you have a family history of breast cancer.  BRCA-related cancer screening. This may be done if you have a family history of breast, ovarian, tubal, or peritoneal cancers.  Pelvic exam and Pap test. This may be done every 3 years starting at age 60. Starting at age 73, this may be done every 5 years if you have a Pap test in combination with an HPV test.  Bone density scan. This is done to screen for osteoporosis. You may have this scan if you are at high risk for osteoporosis.  Discuss your test results, treatment options, and if necessary, the need for more tests with your health care provider. Vaccines Your health care  provider may recommend certain vaccines, such as:  Influenza vaccine. This is recommended every year.  Tetanus, diphtheria, and acellular pertussis (Tdap, Td) vaccine. You may need a Td booster every 10 years.  Varicella vaccine. You may need this if you have not been vaccinated.  Zoster vaccine. You may need this after age 24.  Measles, mumps, and rubella (MMR) vaccine. You may need at least one dose of MMR if you were born in 1957  or later. You may also need a second dose.  Pneumococcal 13-valent conjugate (PCV13) vaccine. You may need this if you have certain conditions and were not previously vaccinated.  Pneumococcal polysaccharide (PPSV23) vaccine. You may need one or two doses if you smoke cigarettes or if you have certain conditions.  Meningococcal vaccine. You may need this if you have certain conditions.  Hepatitis A vaccine. You may need this if you have certain conditions or if you travel or work in places where you may be exposed to hepatitis A.  Hepatitis B vaccine. You may need this if you have certain conditions or if you travel or work in places where you may be exposed to hepatitis B.  Haemophilus influenzae type b (Hib) vaccine. You may need this if you have certain conditions.  Talk to your health care provider about which screenings and vaccines you need and how often you need them. This information is not intended to replace advice given to you by your health care provider. Make sure you discuss any questions you have with your health care provider. Document Released: 12/13/2015 Document Revised: 08/05/2016 Document Reviewed: 09/17/2015 Elsevier Interactive Patient Education  2017 Elsevier Inc.   Preventing Type 2 Diabetes Mellitus Type 2 diabetes (type 2 diabetes mellitus) is a long-term (chronic) disease that affects blood sugar (glucose) levels. Normally, a hormone called insulin allows glucose to enter cells in the body. The cells use glucose for energy. In type 2 diabetes, one or both of these problems may be present:  The body does not make enough insulin.  The body does not respond properly to insulin that it makes (insulin resistance).  Insulin resistance or lack of insulin causes excess glucose to build up in the blood instead of going into cells. As a result, high blood glucose (hyperglycemia) develops, which can cause many complications. Being overweight or obese and having an  inactive (sedentary) lifestyle can increase your risk for diabetes. Type 2 diabetes can be delayed or prevented by making certain nutrition and lifestyle changes. What nutrition changes can be made?  Eat healthy meals and snacks regularly. Keep a healthy snack with you for when you get hungry between meals, such as fruit or a handful of nuts.  Eat lean meats and proteins that are low in saturated fats, such as chicken, fish, egg whites, and beans. Avoid processed meats.  Eat plenty of fruits and vegetables and plenty of grains that have not been processed (whole grains). It is recommended that you eat: ? 1?2 cups of fruit every day. ? 2?3 cups of vegetables every day. ? 6?8 oz of whole grains every day, such as oats, whole wheat, bulgur, brown rice, quinoa, and millet.  Eat low-fat dairy products, such as milk, yogurt, and cheese.  Eat foods that contain healthy fats, such as nuts, avocado, olive oil, and canola oil.  Drink water throughout the day. Avoid drinks that contain added sugar, such as soda or sweet tea.  Follow instructions from your health care provider about specific eating or drinking restrictions.  Control how much food you eat at a time (portion size). ? Check food labels to find out the serving sizes of foods. ? Use a kitchen scale to weigh amounts of foods.  Saute or steam food instead of frying it. Cook with water or broth instead of oils or butter.  Limit your intake of: ? Salt (sodium). Have no more than 1 tsp (2,400 mg) of sodium a day. If you have heart disease or high blood pressure, have less than ? tsp (1,500 mg) of sodium a day. ? Saturated fat. This is fat that is solid at room temperature, such as butter or fat on meat. What lifestyle changes can be made?  Activity  Do moderate-intensity physical activity for at least 30 minutes on at least 5 days of the week, or as much as told by your health care provider.  Ask your health care provider what  activities are safe for you. A mix of physical activities may be best, such as walking, swimming, cycling, and strength training.  Try to add physical activity into your day. For example: ? Park in spots that are farther away than usual, so that you walk more. For example, park in a far corner of the parking lot when you go to the office or the grocery store. ? Take a walk during your lunch break. ? Use stairs instead of elevators or escalators. Weight Loss  Lose weight as directed. Your health care provider can determine how much weight loss is best for you and can help you lose weight safely.  If you are overweight or obese, you may be instructed to lose at least 5?7 % of your body weight. Alcohol and Tobacco   Limit alcohol intake to no more than 1 drink a day for nonpregnant women and 2 drinks a day for men. One drink equals 12 oz of beer, 5 oz of wine, or 1 oz of hard liquor.  Do not use any tobacco products, such as cigarettes, chewing tobacco, and e-cigarettes. If you need help quitting, ask your health care provider. Work With [facility] Provider  Have your blood glucose tested regularly, as told by your health care provider.  Discuss your risk factors and how you can reduce your risk for diabetes.  Get screening tests as told by your health care provider. You may have screening tests regularly, especially if you have certain risk factors for type 2 diabetes.  Make an appointment with a diet and nutrition specialist (registered dietitian). A registered dietitian can help you make a healthy eating plan and can help you understand portion sizes and food labels. Why are these changes important?  It is possible to prevent or delay type 2 diabetes and related health problems by making lifestyle and nutrition changes.  It can be difficult to recognize signs of type 2 diabetes. The best way to avoid possible damage to your body is to take actions to prevent the disease before  you develop symptoms. What can happen if changes are not made?  Your blood glucose levels may keep increasing. Having high blood glucose for a long time is dangerous. Too much glucose in your blood can damage your blood vessels, heart, kidneys, nerves, and eyes.  You may develop prediabetes or type 2 diabetes. Type 2 diabetes can lead to many chronic health problems and complications, such as: ? Heart disease. ? Stroke. ? Blindness. ? Kidney disease. ? Depression. ? Poor circulation in the feet and legs, which could lead to  surgical removal (amputation) in severe cases. Where to find support:  Ask your health care provider to recommend a registered dietitian, diabetes educator, or weight loss program.  Look for local or online weight loss groups.  Join a gym, fitness club, or outdoor activity group, such as a walking club. Where to find more information: To learn more about diabetes and diabetes prevention, visit:  American Diabetes Association (ADA): www.diabetes.[employer] of Diabetes and Digestive and Kidney Diseases: FindSpin.nl  To learn more about healthy eating, visit:  The U.S. Department of Agriculture Scientist, research (physical sciences)), Choose My Plate: http://wiley-williams.com/  Office of Disease Prevention and Health Promotion (ODPHP), Dietary Guidelines: SurferLive.at  Summary  You can reduce your risk for type 2 diabetes by increasing your physical activity, eating healthy foods, and losing weight as directed.  Talk with your health care provider about your risk for type 2 diabetes. Ask about any blood tests or screening tests that you need to have. This information is not intended to replace advice given to you by your health care provider. Make sure you discuss any questions you have with your health care provider. Document Released: 03/09/2016 Document Revised: 04/23/2016 Document Reviewed: 01/07/2016 Elsevier  Interactive Patient Education  [employer].

## 2017-11-02 LAB — CYCLIC CITRUL PEPTIDE ANTIBODY, IGG

## 2017-11-02 LAB — ANA: Anti Nuclear Antibody(ANA): NEGATIVE

## 2017-11-10 ENCOUNTER — Encounter: Payer: Self-pay | Admitting: Internal Medicine

## 2017-11-18 DIAGNOSIS — Z1231 Encounter for screening mammogram for malignant neoplasm of breast: Secondary | ICD-10-CM | POA: Diagnosis not present

## 2017-11-18 LAB — HM MAMMOGRAPHY

## 2017-11-24 ENCOUNTER — Encounter: Payer: Self-pay | Admitting: Internal Medicine

## 2017-12-15 ENCOUNTER — Other Ambulatory Visit: Payer: Self-pay | Admitting: Internal Medicine

## 2017-12-28 ENCOUNTER — Other Ambulatory Visit: Payer: Self-pay | Admitting: Internal Medicine

## 2018-03-01 NOTE — Progress Notes (Signed)
Chief Complaint  Patient presents with  . Follow-up    Pt notes 10lb weight gain over last 2 month. Not sleeping well.     HPI: Sarah Floyd 57 y.o. come in for Chronic disease management     Has noted weight gain in past 2 months  .   ? Could she be depressed.   No hx of same Not sleepoing well.    Hs puffiness   Redoing some of  House  Did counseling after spouse death   Busy   Know what to do for  Dietary changes .  No edema but eyes puffy hair thinning ROS: See pertinent positives and negatives per HPI. No cp sob falling   Past Medical History:  Diagnosis Date  . Hematuria    Dr Joelyn Oms 2001 Trigonitis. Ultrasound and cystoscopy  . Hormone replacement therapy (HRT) 08/30/2013  . Hyperlipidemia   . Hypothyroid    Sp ablation  on meds per Dr Chalmers Cater    Family History  Problem Relation Age of Onset  . Diabetes Unknown   . Hyperlipidemia Unknown   . Heart attack Unknown 16       father  . Thyroid disease Unknown        mom  . Diabetes Mother     Social History   Socioeconomic History  . Marital status: Single    Spouse name: Not on file  . Number of children: Not on file  . Years of education: Not on file  . Highest education level: Not on file  Occupational History  . Not on file  Social Needs  . Financial resource strain: Not on file  . Food insecurity:    Worry: Not on file    Inability: Not on file  . Transportation needs:    Medical: Not on file    Non-medical: Not on file  Tobacco Use  . Smoking status: Former Research scientist (life sciences)  . Smokeless tobacco: Never Used  Substance and Sexual Activity  . Alcohol use: No  . Drug use: No  . Sexual activity: Not on file  Lifestyle  . Physical activity:    Days per week: Not on file    Minutes per session: Not on file  . Stress: Not on file  Relationships  . Social connections:    Talks on phone: Not on file    Gets together: Not on file    Attends religious service: Not on file    Active member of club or  organization: Not on file    Attends meetings of clubs or organizations: Not on file    Relationship status: Not on file  Other Topics Concern  . Not on file  Social History Narrative   hh 1  +   Pet dog  Passed in June 15    Exercise  No etoh.   Sleep  7-8 hours.      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 CALCIUM CITRATE-VITAMIN D PO Take by mouth. 1000 MG OF Calcium and 500 iu of vitamin D    . Krill Oil 300 MG CAPS Take 100 mg by mouth daily.     . Multiple Vitamin (MULTIVITAMIN) tablet Take 1 tablet by mouth daily.      . Psyllium (METAMUCIL PO) 1 TSP twice daily    . simvastatin (  ZOCOR) 20 MG tablet TAKE 1 TABLET AT BEDTIME 90 tablet 1  . SYNTHROID 100 MCG tablet TAKE 1 TABLET DAILY 90 tablet 2  . Turmeric POWD by Does not apply route. 1 scoop daily     No facility-administered medications prior to visit.      EXAM:  BP 108/70 (BP Location: Right Arm, Patient Position: Sitting, Cuff Size: Normal)   Pulse 65   Temp 98.2 F (36.8 C) (Oral)   Wt 172 lb 12.8 oz (78.4 kg)   LMP 05/30/2012   BMI 33.75 kg/m   Body mass index is 33.75 kg/m.  GENERAL: vitals reviewed and listed above, alert, oriented, appears well hydrated and in no acute distress HEENT: atraumatic, conjunctiva  clear, no obvious abnormalities on inspection of external nose and ears PSYCH: pleasant and cooperative,mild anxiety  Nl speech and  Thought   BP Readings from Last 3 Encounters:  03/02/18 108/70  11/01/17 108/80  04/27/17 110/70   Wt Readings from Last 3 Encounters:  03/02/18 172 lb 12.8 oz (78.4 kg)  11/01/17 162 lb 9.6 oz (73.8 kg)  04/27/17 166 lb 12.8 oz (75.7 kg)    ASSESSMENT AND PLAN:  Discussed the following assessment and plan:  Hyperglycemia - Plan: TSH, Basic metabolic panel, Hemoglobin A1c  Medication management - Plan: TSH, Basic metabolic panel,  Hemoglobin A1c  Hyperlipidemia, unspecified hyperlipidemia type  Hypothyroidism, unspecified type - Plan: TSH, Basic metabolic panel, Hemoglobin A1c  Family history of diabetes mellitus  Weight gain - Plan: TSH, Basic metabolic panel, Hemoglobin A1c  Sleep disturbance - Plan: TSH, Basic metabolic panel, Hemoglobin A1c Total visit 37mins > 50% spent counseling and coordinating care as indicated in above note and in instructions to patient .  -Patient advised to return or notify health care team  if  new concerns arise.  Patient Instructions  Agree pay attention to sleep hygiene .  Consider  Seeing nutrition . And even counseling about sleep .   Plan   Fu depending on  Labs and  Or 4 months .   Insomnia Insomnia is a sleep disorder that makes it difficult to fall asleep or to stay asleep. Insomnia can cause tiredness (fatigue), low energy, difficulty concentrating, mood swings, and poor performance at work or school. There are three different ways to classify insomnia:  Difficulty falling asleep.  Difficulty staying asleep.  Waking up too early in the morning.  Any type of insomnia can be long-term (chronic) or short-term (acute). Both are common. Short-term insomnia usually lasts for three months or less. Chronic insomnia occurs at least three times a week for longer than three months. What are the causes? Insomnia may be caused by another condition, situation, or substance, such as:  Anxiety.  Certain medicines.  Gastroesophageal reflux disease (GERD) or other gastrointestinal conditions.  Asthma or other breathing conditions.  Restless legs syndrome, sleep apnea, or other sleep disorders.  Chronic pain.  Menopause. This may include hot flashes.  Stroke.  Abuse of alcohol, tobacco, or illegal drugs.  Depression.  Caffeine.  Neurological disorders, such as Alzheimer disease.  An overactive thyroid (hyperthyroidism).  The cause of insomnia may not be  known. What increases the risk? Risk factors for insomnia include:  Gender. Women are more commonly affected than men.  Age. Insomnia is more common as you get older.  Stress. This may involve your professional or personal life.  Income. Insomnia is more common in people with lower income.  Lack of exercise.  Irregular work schedule  or night shifts.  Traveling between different time zones.  What are the signs or symptoms? If you have insomnia, trouble falling asleep or trouble staying asleep is the main symptom. This may lead to other symptoms, such as:  Feeling fatigued.  Feeling nervous about going to sleep.  Not feeling rested in the morning.  Having trouble concentrating.  Feeling irritable, anxious, or depressed.  How is this treated? Treatment for insomnia depends on the cause. If your insomnia is caused by an underlying condition, treatment will focus on addressing the condition. Treatment may also include:  Medicines to help you sleep.  Counseling or therapy.  Lifestyle adjustments.  Follow these instructions at home:  Take medicines only as directed by your health care provider.  Keep regular sleeping and waking hours. Avoid naps.  Keep a sleep diary to help you and your health care provider figure out what could be causing your insomnia. Include: ? When you sleep. ? When you wake up during the night. ? How well you sleep. ? How rested you feel the next day. ? Any side effects of medicines you are taking. ? What you eat and drink.  Make your bedroom a comfortable place where it is easy to fall asleep: ? Put up shades or special blackout curtains to block light from outside. ? Use a white noise machine to block noise. ? Keep the temperature cool.  Exercise regularly as directed by your health care provider. Avoid exercising right before bedtime.  Use relaxation techniques to manage stress. Ask your health care provider to suggest some techniques  that may work well for you. These may include: ? Breathing exercises. ? Routines to release muscle tension. ? Visualizing peaceful scenes.  Cut back on alcohol, caffeinated beverages, and cigarettes, especially close to bedtime. These can disrupt your sleep.  Do not overeat or eat spicy foods right before bedtime. This can lead to digestive discomfort that can make it hard for you to sleep.  Limit screen use before bedtime. This includes: ? Watching TV. ? Using your smartphone, tablet, and computer.  Stick to a routine. This can help you fall asleep faster. Try to do a quiet activity, brush your teeth, and go to bed at the same time each night.  Get out of bed if you are still awake after 15 minutes of trying to sleep. Keep the lights down, but try reading or doing a quiet activity. When you feel sleepy, go back to bed.  Make sure that you drive carefully. Avoid driving if you feel very sleepy.  Keep all follow-up appointments as directed by your health care provider. This is important. Contact a health care provider if:  You are tired throughout the day or have trouble in your daily routine due to sleepiness.  You continue to have sleep problems or your sleep problems get worse. Get help right away if:  You have serious thoughts about hurting yourself or someone else. This information is not intended to replace advice given to you by your health care provider. Make sure you discuss any questions you have with your health care provider. Document Released: 11/13/2000 Document Revised: 04/17/2016 Document Reviewed: 08/17/2014 Elsevier Interactive Patient Education  2018 [employer]. Panosh M.D.   Lab Results  Component Value Date   WBC 6.6 11/01/2017   HGB 13.6 11/01/2017   HCT 42.4 11/01/2017   PLT 224.0 11/01/2017   GLUCOSE 82 03/02/2018   CHOL 208 (H) 11/01/2017  TRIG 162.0 (H) 11/01/2017   HDL 49.70 11/01/2017   LDLDIRECT 108.6 01/29/2009   LDLCALC  126 (H) 11/01/2017   ALT 21 11/01/2017   AST 20 11/01/2017   NA 140 03/02/2018   K 4.2 03/02/2018   CL 103 03/02/2018   CREATININE 0.74 03/02/2018   BUN 14 03/02/2018   CO2 31 03/02/2018   TSH 0.75 03/02/2018   HGBA1C 6.5 03/02/2018

## 2018-03-02 ENCOUNTER — Encounter: Payer: Self-pay | Admitting: Internal Medicine

## 2018-03-02 ENCOUNTER — Ambulatory Visit (INDEPENDENT_AMBULATORY_CARE_PROVIDER_SITE_OTHER): Payer: 59 | Admitting: Internal Medicine

## 2018-03-02 VITALS — BP 108/70 | HR 65 | Temp 98.2°F | Wt 172.8 lb

## 2018-03-02 DIAGNOSIS — Z833 Family history of diabetes mellitus: Secondary | ICD-10-CM

## 2018-03-02 DIAGNOSIS — Z79899 Other long term (current) drug therapy: Secondary | ICD-10-CM

## 2018-03-02 DIAGNOSIS — R635 Abnormal weight gain: Secondary | ICD-10-CM | POA: Diagnosis not present

## 2018-03-02 DIAGNOSIS — E039 Hypothyroidism, unspecified: Secondary | ICD-10-CM | POA: Diagnosis not present

## 2018-03-02 DIAGNOSIS — G479 Sleep disorder, unspecified: Secondary | ICD-10-CM

## 2018-03-02 DIAGNOSIS — R739 Hyperglycemia, unspecified: Secondary | ICD-10-CM

## 2018-03-02 DIAGNOSIS — E785 Hyperlipidemia, unspecified: Secondary | ICD-10-CM

## 2018-03-02 LAB — BASIC METABOLIC PANEL
BUN: 14 mg/dL (ref 6–23)
CO2: 31 mEq/L (ref 19–32)
CREATININE: 0.74 mg/dL (ref 0.40–1.20)
Calcium: 9.9 mg/dL (ref 8.4–10.5)
Chloride: 103 mEq/L (ref 96–112)
GFR: 85.75 mL/min (ref >60.00)
Glucose, Bld: 82 mg/dL (ref 70–99)
POTASSIUM: 4.2 meq/L (ref 3.5–5.1)
Sodium: 140 mEq/L (ref 135–145)

## 2018-03-02 LAB — TSH: TSH: 0.75 u[IU]/mL (ref 0.35–4.50)

## 2018-03-02 LAB — HEMOGLOBIN A1C: HEMOGLOBIN A1C: 6.5 % (ref 4.6–6.5)

## 2018-03-02 NOTE — Patient Instructions (Addendum)
Agree pay attention to sleep hygiene .  Consider  Seeing nutrition . And even counseling about sleep .   Plan   Fu depending on  Labs and  Or 4 months .   Insomnia Insomnia is a sleep disorder that makes it difficult to fall asleep or to stay asleep. Insomnia can cause tiredness (fatigue), low energy, difficulty concentrating, mood swings, and poor performance at work or school. There are three different ways to classify insomnia:  Difficulty falling asleep.  Difficulty staying asleep.  Waking up too early in the morning.  Any type of insomnia can be long-term (chronic) or short-term (acute). Both are common. Short-term insomnia usually lasts for three months or less. Chronic insomnia occurs at least three times a week for longer than three months. What are the causes? Insomnia may be caused by another condition, situation, or substance, such as:  Anxiety.  Certain medicines.  Gastroesophageal reflux disease (GERD) or other gastrointestinal conditions.  Asthma or other breathing conditions.  Restless legs syndrome, sleep apnea, or other sleep disorders.  Chronic pain.  Menopause. This may include hot flashes.  Stroke.  Abuse of alcohol, tobacco, or illegal drugs.  Depression.  Caffeine.  Neurological disorders, such as Alzheimer disease.  An overactive thyroid (hyperthyroidism).  The cause of insomnia may not be known. What increases the risk? Risk factors for insomnia include:  Gender. Women are more commonly affected than men.  Age. Insomnia is more common as you get older.  Stress. This may involve your professional or personal life.  Income. Insomnia is more common in people with lower income.  Lack of exercise.  Irregular work schedule or night shifts.  Traveling between different time zones.  What are the signs or symptoms? If you have insomnia, trouble falling asleep or trouble staying asleep is the main symptom. This may lead to other  symptoms, such as:  Feeling fatigued.  Feeling nervous about going to sleep.  Not feeling rested in the morning.  Having trouble concentrating.  Feeling irritable, anxious, or depressed.  How is this treated? Treatment for insomnia depends on the cause. If your insomnia is caused by an underlying condition, treatment will focus on addressing the condition. Treatment may also include:  Medicines to help you sleep.  Counseling or therapy.  Lifestyle adjustments.  Follow these instructions at home:  Take medicines only as directed by your health care provider.  Keep regular sleeping and waking hours. Avoid naps.  Keep a sleep diary to help you and your health care provider figure out what could be causing your insomnia. Include: ? When you sleep. ? When you wake up during the night. ? How well you sleep. ? How rested you feel the next day. ? Any side effects of medicines you are taking. ? What you eat and drink.  Make your bedroom a comfortable place where it is easy to fall asleep: ? Put up shades or special blackout curtains to block light from outside. ? Use a white noise machine to block noise. ? Keep the temperature cool.  Exercise regularly as directed by your health care provider. Avoid exercising right before bedtime.  Use relaxation techniques to manage stress. Ask your health care provider to suggest some techniques that may work well for you. These may include: ? Breathing exercises. ? Routines to release muscle tension. ? Visualizing peaceful scenes.  Cut back on alcohol, caffeinated beverages, and cigarettes, especially close to bedtime. These can disrupt your sleep.  Do not overeat or eat  spicy foods right before bedtime. This can lead to digestive discomfort that can make it hard for you to sleep.  Limit screen use before bedtime. This includes: ? Watching TV. ? Using your smartphone, tablet, and computer.  Stick to a routine. This can help you fall  asleep faster. Try to do a quiet activity, brush your teeth, and go to bed at the same time each night.  Get out of bed if you are still awake after 15 minutes of trying to sleep. Keep the lights down, but try reading or doing a quiet activity. When you feel sleepy, go back to bed.  Make sure that you drive carefully. Avoid driving if you feel very sleepy.  Keep all follow-up appointments as directed by your health care provider. This is important. Contact a health care provider if:  You are tired throughout the day or have trouble in your daily routine due to sleepiness.  You continue to have sleep problems or your sleep problems get worse. Get help right away if:  You have serious thoughts about hurting yourself or someone else. This information is not intended to replace advice given to you by your health care provider. Make sure you discuss any questions you have with your health care provider. Document Released: 11/13/2000 Document Revised: 04/17/2016 Document Reviewed: 08/17/2014 Elsevier Interactive Patient Education  [employer].

## 2018-03-08 DIAGNOSIS — L821 Other seborrheic keratosis: Secondary | ICD-10-CM | POA: Diagnosis not present

## 2018-03-08 DIAGNOSIS — L304 Erythema intertrigo: Secondary | ICD-10-CM | POA: Diagnosis not present

## 2018-03-08 DIAGNOSIS — D1801 Hemangioma of skin and subcutaneous tissue: Secondary | ICD-10-CM | POA: Diagnosis not present

## 2018-03-10 ENCOUNTER — Encounter: Payer: Self-pay | Admitting: Internal Medicine

## 2018-03-10 NOTE — Telephone Encounter (Signed)
Please advise Dr Regis Bill - Pt requesting further testing, thanks.

## 2018-03-23 NOTE — Telephone Encounter (Signed)
I agee that can look further   Could be a good idea    But  There ar a couple of different adrenal tests .     I suggest we get   You back to endocrinology for consult    May we   Refer you back to dr Chalmers Cater for this ?

## 2018-07-01 NOTE — Progress Notes (Signed)
Chief Complaint  Patient presents with  . Follow-up    Pt reports changing her diet and following lifestyle intervention as directed per Dr Regis Bill at last [facility].     HPI: Sarah Floyd 57 y.o. come in for Chronic disease management  Had cut out breads and pastas .    Aware of  Vulnerable times and sleep some better  BWeight  gain   lsoet weight    Sleep  Helped   By [employer].  [facility] lost some weight and  Activity  No new sx  ROS: See pertinent positives and negatives per HPI.  Past Medical History:  Diagnosis Date  . Hematuria    Dr Joelyn Oms 2001 Trigonitis. Ultrasound and cystoscopy  . Hormone replacement therapy (HRT) 08/30/2013  . Hyperlipidemia   . Hypothyroid    Sp ablation  on meds per Dr Chalmers Cater    Family History  Problem Relation Age of Onset  . Diabetes Unknown   . Hyperlipidemia Unknown   . Heart attack Unknown 67       father  . Thyroid disease Unknown        mom  . Diabetes Mother     Social History   Socioeconomic History  . Marital status: Single    Spouse name: Not on file  . Number of children: Not on file  . Years of education: Not on file  . Highest education level: Not on file  Occupational History  . Not on file  Social Needs  . Financial resource strain: Not on file  . Food insecurity:    Worry: Not on file    Inability: Not on file  . Transportation needs:    Medical: Not on file    Non-medical: Not on file  Tobacco Use  . Smoking status: Former Research scientist (life sciences)  . Smokeless tobacco: Never Used  Substance and Sexual Activity  . Alcohol use: No  . Drug use: No  . Sexual activity: Not on file  Lifestyle  . Physical activity:    Days per week: Not on file    Minutes per session: Not on file  . Stress: Not on file  Relationships  . Social connections:    Talks on phone: Not on file    Gets together: Not on file    Attends religious service: Not on file    Active member of club or organization: Not on file    Attends meetings of  clubs or organizations: Not on file    Relationship status: Not on file  Other Topics Concern  . Not on file  Social History Narrative   hh 1  +   Pet dog  Passed in June 15    Exercise  No etoh.   Sleep  7-8 hours.      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 CALCIUM CITRATE-VITAMIN D PO Take by mouth. 1000 MG OF Calcium and 500 iu of vitamin D    . Krill Oil 300 MG CAPS Take 100 mg by mouth daily.     . Multiple Vitamin (MULTIVITAMIN) tablet Take 1 tablet by mouth daily.      . Psyllium (METAMUCIL PO) 1 TSP twice daily    . simvastatin (ZOCOR) 20 MG tablet TAKE 1 TABLET AT BEDTIME 90 tablet 1  . SYNTHROID 100 MCG tablet  TAKE 1 TABLET DAILY 90 tablet 2  . Turmeric POWD by Does not apply route. 1 scoop daily     No facility-administered medications prior to visit.      EXAM:  BP 108/70 (BP Location: Right Arm, Patient Position: Sitting, Cuff Size: Normal)   Pulse 66   Temp 97.9 F (36.6 C) (Oral)   Wt 163 lb 1.6 oz (74 kg)   LMP 05/30/2012   BMI 31.85 kg/m   Body mass index is 31.85 kg/m.  GENERAL: vitals reviewed and listed above, alert, oriented, appears well hydrated and in no acute distress  HEENT: atraumatic, conjunctiva  clear, no obvious abnormalities on inspection of external nose and ears OP : no lesion edema or exudate  NECK: no obvious masses on inspection palpation  LUNGS: clear to auscultation bilaterally, no wheezes, rales or rhonchi, good air movement CV: HRRR, no clubbing cyanosis or  peripheral edema nl cap refill  MS: moves all extremities without noticeable focal  abnormality PSYCH: pleasant and cooperative, no obvious depression or anxiety Lab Results  Component Value Date   WBC 6.6 11/01/2017   HGB 13.6 11/01/2017   HCT 42.4 11/01/2017   PLT 224.0 11/01/2017   GLUCOSE 82 03/02/2018   CHOL 208 (H) 11/01/2017   TRIG 162.0  (H) 11/01/2017   HDL 49.70 11/01/2017   LDLDIRECT 108.6 01/29/2009   LDLCALC 126 (H) 11/01/2017   ALT 21 11/01/2017   AST 20 11/01/2017   NA 140 03/02/2018   K 4.2 03/02/2018   CL 103 03/02/2018   CREATININE 0.74 03/02/2018   BUN 14 03/02/2018   CO2 31 03/02/2018   TSH 0.75 03/02/2018   HGBA1C 5.8 (A) 07/04/2018   BP Readings from Last 3 Encounters:  07/04/18 108/70  03/02/18 108/70  11/01/17 108/80   Wt Readings from Last 3 Encounters:  07/04/18 163 lb 1.6 oz (74 kg)  03/02/18 172 lb 12.8 oz (78.4 kg)  11/01/17 162 lb 9.6 oz (73.8 kg)    ASSESSMENT AND PLAN:  Discussed the following assessment and plan:  Hyperglycemia - Plan: Basic metabolic panel, CBC with Differential/Platelet, Hemoglobin A1c, Hepatic function panel, Lipid panel, TSH, POC HgB A1c  Medication management - Plan: Basic metabolic panel, CBC with Differential/Platelet, Hemoglobin A1c, Hepatic function panel, Lipid panel, TSH, POC HgB A1c  Hyperlipidemia, unspecified hyperlipidemia type - Plan: Basic metabolic panel, CBC with Differential/Platelet, Hemoglobin A1c, Hepatic function panel, Lipid panel, TSH  Hypothyroidism, unspecified type - Plan: Basic metabolic panel, CBC with Differential/Platelet, Hemoglobin A1c, Hepatic function panel, Lipid panel, TSH  Visit for preventive health examination - Plan: Basic metabolic panel, CBC with Differential/Platelet, Hemoglobin A1c, Hepatic function panel, Lipid panel, TSH So much better  a1c drop  6.5 - 5.8 !and  Encouraged continued  Success   Counseled. And continue  Same   Plan  cpx labs and  cpx in early December  Lab pre visit ok  -Patient advised to return or notify health care team  if  new concerns arise.  Patient Instructions  Keep up the monitoring  And  Sleep  Hygiene      Slow ands steady wins the race.   Plan cpx and fulllabs  With a1c in [place]. Rainer Mounce M.D.

## 2018-07-04 ENCOUNTER — Ambulatory Visit (INDEPENDENT_AMBULATORY_CARE_PROVIDER_SITE_OTHER): Payer: 59 | Admitting: Internal Medicine

## 2018-07-04 ENCOUNTER — Encounter: Payer: Self-pay | Admitting: Internal Medicine

## 2018-07-04 ENCOUNTER — Other Ambulatory Visit: Payer: Self-pay | Admitting: Internal Medicine

## 2018-07-04 VITALS — BP 108/70 | HR 66 | Temp 97.9°F | Wt 163.1 lb

## 2018-07-04 DIAGNOSIS — E785 Hyperlipidemia, unspecified: Secondary | ICD-10-CM | POA: Diagnosis not present

## 2018-07-04 DIAGNOSIS — R739 Hyperglycemia, unspecified: Secondary | ICD-10-CM | POA: Diagnosis not present

## 2018-07-04 DIAGNOSIS — Z0000 Encounter for general adult medical examination without abnormal findings: Secondary | ICD-10-CM

## 2018-07-04 DIAGNOSIS — Z79899 Other long term (current) drug therapy: Secondary | ICD-10-CM

## 2018-07-04 DIAGNOSIS — E039 Hypothyroidism, unspecified: Secondary | ICD-10-CM

## 2018-07-04 LAB — POCT GLYCOSYLATED HEMOGLOBIN (HGB A1C): HEMOGLOBIN A1C: 5.8 % — AB (ref 4.0–5.6)

## 2018-07-04 MED ORDER — SIMVASTATIN 20 MG PO TABS: 20.0000 mg | ORAL_TABLET | Freq: Every day | ORAL | 1 refills | Status: DC

## 2018-07-04 NOTE — Patient Instructions (Addendum)
Keep up the monitoring  And  Sleep  Hygiene      Slow ands steady wins the race.   Plan cpx and fulllabs  With a1c in dec   Jan

## 2018-07-04 NOTE — Addendum Note (Signed)
Addended by: Virl Cagey on: 07/04/2018 04:12 PM   Modules accepted: Orders

## 2018-09-07 ENCOUNTER — Other Ambulatory Visit: Payer: Self-pay | Admitting: Internal Medicine

## 2018-09-13 ENCOUNTER — Ambulatory Visit (INDEPENDENT_AMBULATORY_CARE_PROVIDER_SITE_OTHER): Payer: 59

## 2018-09-13 DIAGNOSIS — Z23 Encounter for immunization: Secondary | ICD-10-CM | POA: Diagnosis not present

## 2018-10-24 ENCOUNTER — Other Ambulatory Visit (INDEPENDENT_AMBULATORY_CARE_PROVIDER_SITE_OTHER): Payer: 59

## 2018-10-24 DIAGNOSIS — E785 Hyperlipidemia, unspecified: Secondary | ICD-10-CM | POA: Diagnosis not present

## 2018-10-24 DIAGNOSIS — E039 Hypothyroidism, unspecified: Secondary | ICD-10-CM | POA: Diagnosis not present

## 2018-10-24 DIAGNOSIS — Z79899 Other long term (current) drug therapy: Secondary | ICD-10-CM

## 2018-10-24 DIAGNOSIS — Z0000 Encounter for general adult medical examination without abnormal findings: Secondary | ICD-10-CM

## 2018-10-24 DIAGNOSIS — R739 Hyperglycemia, unspecified: Secondary | ICD-10-CM | POA: Diagnosis not present

## 2018-10-24 LAB — HEPATIC FUNCTION PANEL
ALK PHOS: 73 U/L (ref 39–117)
ALT: 26 U/L (ref 0–35)
AST: 21 U/L (ref 0–37)
Albumin: 4.4 g/dL (ref 3.5–5.2)
Bilirubin, Direct: 0.1 mg/dL (ref 0.0–0.3)
TOTAL PROTEIN: 6.9 g/dL (ref 6.0–8.3)
Total Bilirubin: 0.6 mg/dL (ref 0.2–1.2)

## 2018-10-24 LAB — BASIC METABOLIC PANEL
BUN: 13 mg/dL (ref 6–23)
CALCIUM: 9.8 mg/dL (ref 8.4–10.5)
CO2: 29 mEq/L (ref 19–32)
CREATININE: 0.84 mg/dL (ref 0.40–1.20)
Chloride: 103 mEq/L (ref 96–112)
GFR: 73.92 mL/min (ref >60.00)
Glucose, Bld: 130 mg/dL — ABNORMAL HIGH (ref 70–99)
POTASSIUM: 4.2 meq/L (ref 3.5–5.1)
Sodium: 140 mEq/L (ref 135–145)

## 2018-10-24 LAB — CBC WITH DIFFERENTIAL/PLATELET
Basophils Absolute: 0.1 10*3/uL (ref 0.0–0.1)
Basophils Relative: 0.9 % (ref 0.0–3.0)
EOS PCT: 3.7 % (ref 0.0–5.0)
Eosinophils Absolute: 0.3 10*3/uL (ref 0.0–0.7)
HEMATOCRIT: 41.6 % (ref 36.0–46.0)
Hemoglobin: 13.6 g/dL (ref 12.0–15.0)
LYMPHS ABS: 2.9 10*3/uL (ref 0.7–4.0)
Lymphocytes Relative: 37.4 % (ref 12.0–46.0)
MCHC: 32.7 g/dL (ref 30.0–36.0)
MCV: 90.3 fl (ref 78.0–100.0)
MONOS PCT: 7.1 % (ref 3.0–12.0)
Monocytes Absolute: 0.5 10*3/uL (ref 0.1–1.0)
NEUTROS ABS: 3.9 10*3/uL (ref 1.4–7.7)
NEUTROS PCT: 50.9 % (ref 43.0–77.0)
PLATELETS: 227 10*3/uL (ref 150.0–400.0)
RBC: 4.61 Mil/uL (ref 3.87–5.11)
RDW: 14.2 % (ref 11.5–15.5)
WBC: 7.7 10*3/uL (ref 4.0–10.5)

## 2018-10-24 LAB — LIPID PANEL
Cholesterol: 195 mg/dL (ref 0–200)
HDL: 46 mg/dL (ref >39.00)
NonHDL: 148.89
Total CHOL/HDL Ratio: 4
Triglycerides: 225 mg/dL — ABNORMAL HIGH (ref 0.0–149.0)
VLDL: 45 mg/dL — ABNORMAL HIGH (ref 0.0–40.0)

## 2018-10-24 LAB — TSH: TSH: 0.51 u[IU]/mL (ref 0.35–4.50)

## 2018-10-24 LAB — LDL CHOLESTEROL, DIRECT: Direct LDL: 124 mg/dL

## 2018-10-24 LAB — HEMOGLOBIN A1C: Hgb A1c MFr Bld: 6.5 % (ref 4.6–6.5)

## 2018-11-01 NOTE — Progress Notes (Signed)
Chief Complaint  Patient presents with  . Annual Exam    HPI: Patient  Sarah Floyd  57 y.o. comes in today for Advance visit  And some issues  Has gained weight and hard to lose  Thyroid   Daily    Taking  Ok same HLd   Simvastatin  Nose noted  Sleep and memory problematic  Hard to make decisions somtimes Hard to multitask  At this time  .  ? Stress  Had been doing better in summer   Health Maintenance  Topic Date Due  . Hepatitis C Screening  Sep 05, 1961  . PNEUMOCOCCAL POLYSACCHARIDE VACCINE AGE 50-64 HIGH RISK  06/04/1962  . FOOT EXAM  06/04/1970  . URINE MICROALBUMIN  06/04/1970  . HIV Screening  06/05/1975  . OPHTHALMOLOGY EXAM  02/28/2018  . PAP SMEAR  10/01/2018  . HEMOGLOBIN A1C  04/24/2019  . MAMMOGRAM  11/19/2019  . COLONOSCOPY  04/07/2021  . TETANUS/TDAP  10/01/2025  . INFLUENZA VACCINE  Completed   Health Maintenance Review LIFESTYLE:  Exercise:   2 x per week  zumba or treadmill    Tobacco/ETS: no Alcohol:   no Sugar beverages:  Sleep: ok   7 hours  Drug use: no HH of  1 no pets  Work:  [address]     ROS:  GEN/ HEENT: No fever, significant weight changes sweats headaches vision problems hearing changes, CV/ PULM; No chest pain shortness of breath cough, syncope,edema  change in exercise tolerance. GI /GU: No adominal pain, vomiting, change in bowel habits. No blood in the stool. No significant GU symptoms. SKIN/HEME: ,no acute skin rashes suspicious lesions or bleeding. No lymphadenopathy, nodules, masses.  NEURO/ PSYCH:  No neurologic signs such as weakness numbness. No depression anxiety. IMM/ Allergy: No unusual infections.  Allergy .   REST of 12 system review negative except as per HPI   Past Medical History:  Diagnosis Date  . Hematuria    Dr Joelyn Oms 2001 Trigonitis. Ultrasound and cystoscopy  . Hormone replacement therapy (HRT) 08/30/2013  . Hyperlipidemia   . Hypothyroid    Sp ablation  on meds per Dr Chalmers Cater    Past  Surgical History:  Procedure Laterality Date  . LIPOMA EXCISION     left buttock 3 cm    Family History  Problem Relation Age of Onset  . Diabetes Unknown   . Hyperlipidemia Unknown   . Heart attack Unknown 73       father  . Thyroid disease Unknown        mom  . Diabetes Mother     Social History   Socioeconomic History  . Marital status: Single    Spouse name: Not on file  . Number of children: Not on file  . Years of education: Not on file  . Highest education level: Not on file  Occupational History  . Not on file  Social Needs  . Financial resource strain: Not on file  . Food insecurity:    Worry: Not on file    Inability: Not on file  . Transportation needs:    Medical: Not on file    Non-medical: Not on file  Tobacco Use  . Smoking status: Former Research scientist (life sciences)  . Smokeless tobacco: Never Used  Substance and Sexual Activity  . Alcohol use: No  . Drug use: No  . Sexual activity: Not on file  Lifestyle  . Physical activity:    Days per week: Not on file  Minutes per session: Not on file  . Stress: Not on file  Relationships  . Social connections:    Talks on phone: Not on file    Gets together: Not on file    Attends religious service: Not on file    Active member of club or organization: Not on file    Attends meetings of clubs or organizations: Not on file    Relationship status: Not on file  Other Topics Concern  . Not on file  Social History Narrative   hh 1  +   Pet dog  Passed in June 15    Exercise  No etoh.   Sleep  7-8 hours.      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 CALCIUM CITRATE-VITAMIN D PO Take by mouth. 1000 MG OF Calcium and 500 iu of vitamin D    . Krill Oil 300 MG CAPS Take 100 mg by mouth daily.     . Multiple Vitamin (MULTIVITAMIN) tablet Take 1 tablet by mouth daily.      . Psyllium (METAMUCIL PO) 1 TSP  twice daily    . SYNTHROID 100 MCG tablet TAKE 1 TABLET DAILY 90 tablet 0  . Turmeric POWD by Does not apply route. 1 scoop daily    . simvastatin (ZOCOR) 20 MG tablet Take 1 tablet (20 mg total) by mouth at bedtime. 90 tablet 1   No facility-administered medications prior to visit.      EXAM:  BP 118/68 (BP Location: Right Arm, Patient Position: Sitting, Cuff Size: Normal)   Pulse 78   Temp 98.2 F (36.8 C) (Oral)   Ht 4' 11.84" (1.52 m)   Wt 174 lb 12.8 oz (79.3 kg)   LMP 05/30/2012   BMI 34.32 kg/m   Body mass index is 34.32 kg/m. Wt Readings from Last 3 Encounters:  11/02/18 174 lb 12.8 oz (79.3 kg)  07/04/18 163 lb 1.6 oz (74 kg)  03/02/18 172 lb 12.8 oz (78.4 kg)    Physical Exam: Vital signs reviewed JIR:CVEL is a well-developed well-nourished alert cooperative    who appearsr stated age in no acute distress.  HEENT: normocephalic atraumatic , Eyes: PERRL EOM's full, conjunctiva clear, Nares: paten,t no deformity discharge or tenderness., Ears: no deformity EAC's clear TMs with normal landmarks. Mouth: clear OP, no lesions, edema.  Moist mucous membranes. Dentition in adequate repair. NECK: supple without masses,or bruits. CHEST/PULM:  Clear to auscultation and percussion breath sounds equal no wheeze , rales or rhonchi. No chest wall deformities or tenderness. Breast: normal by inspection . No dimpling, discharge, masses, tenderness or discharge . CV: PMI is nondisplaced, S1 S2 no gallops, murmurs, rubs. Peripheral pulses are full without delay.No JVD .  ABDOMEN: Bowel sounds normal nontender  No guard or rebound, no hepato splenomegal no CVA tenderness.  No hernia. Extremtities:  No clubbing cyanosis or edema, no acute joint swelling or redness no focal atrophy NEURO:  Oriented x3, cranial nerves 3-12 appear to be intact, no obvious focal weakness,gait within normal limits no abnormal reflexes or asymmetrical SKIN: No acute rashes normal turgor, color, no bruising or  petechiae. PSYCH: Oriented, good eye contact, no obvious depression anxiety, cognition and judgment appear normal. LN: no cervical axillary i adenopathy  Lab Results  Component Value Date   WBC 7.7 10/24/2018   HGB  13.6 10/24/2018   HCT 41.6 10/24/2018   PLT 227.0 10/24/2018   GLUCOSE 130 (H) 10/24/2018   CHOL 195 10/24/2018   TRIG 225.0 (H) 10/24/2018   HDL 46.00 10/24/2018   LDLDIRECT 124.0 10/24/2018   LDLCALC 126 (H) 11/01/2017   ALT 26 10/24/2018   AST 21 10/24/2018   NA 140 10/24/2018   K 4.2 10/24/2018   CL 103 10/24/2018   CREATININE 0.84 10/24/2018   BUN 13 10/24/2018   CO2 29 10/24/2018   TSH 0.51 10/24/2018   HGBA1C 6.5 10/24/2018    BP Readings from Last 3 Encounters:  11/02/18 118/68  07/04/18 108/70  03/02/18 108/70    Lab results reviewed with patient   ASSESSMENT AND PLAN:  Discussed the following assessment and plan:  Visit for preventive health examination  Medication management  Hyperlipidemia, unspecified hyperlipidemia type  Hypothyroidism, unspecified type  Hyperglycemia  Family history of diabetes mellitus  Weight gain Counseled. About  Sleep stress  Memory   Organization  And mood   Poss still some   Grief issues and adaptation from caretaker  Role in years past  Intensify  Statin trial .    Fu  onm  3-4 months       Well being  Optimize sleep eating and activity  And follow labs  Patient Care Team: Kamoni Gentles, Standley Brooking, MD as PCP - General Jacelyn Pi, MD (Endocrinology) Milus Banister, MD (Gastroenterology) Patient Instructions  consider adding  Metformin    Because of  Blood sugar risen. . Tracking   Intake sleep and exercise .  consider a prediabetic program.   Can do pap next year  Last was normal with  Negative   Hi risk HPV.  fwr 7-8 hours sleep .  No eating after 7 pm  Make a plan for  Food intack  Most of calories in med day . Water instead of other veverages .  Consider counseling if stuck  In grief and  Mood .      ROV  in 3-4 months for [place] ot check the a1c .  And lipids at some point .   Increase  The simvastatin to 40 mg per day .  In the interim .   Avoid processed carrbs       In the interim   .    Health Maintenance, Female Adopting a healthy lifestyle and getting preventive care can go a long way to promote health and wellness. Talk with your health care provider about what schedule of regular examinations is right for you. This is a good chance for you to check in with your provider about disease prevention and staying healthy. In between checkups, there are plenty of things you can do on your own. Experts have done a lot of research about which lifestyle changes and preventive measures are most likely to keep you healthy. Ask your health care provider for more information. Weight and diet Eat a healthy diet  Be sure to include plenty of vegetables, fruits, low-fat dairy products, and lean protein.  Do not eat a lot of foods high in solid fats, added sugars, or salt.  Get regular exercise. This is one of the most important things you can do for your health. ? Most adults should exercise for at least 150 minutes each week. The exercise should increase your heart rate and make you sweat (moderate-intensity exercise). ? Most adults should also do strengthening exercises at least twice a week. This is in addition  to the moderate-intensity exercise.  Maintain a healthy weight  Body mass index (BMI) is a measurement that can be used to identify possible weight problems. It estimates body fat based on height and weight. Your health care provider can help determine your BMI and help you achieve or maintain a healthy weight.  For females 100 years of age and older: ? A BMI below 18.5 is considered underweight. ? A BMI of 18.5 to 24.9 is normal. ? A BMI of 25 to 29.9 is considered overweight. ? A BMI of 30 and above is considered obese.  Watch levels of cholesterol and blood lipids  You  should start having your blood tested for lipids and cholesterol at 58 years of age, then have this test every 5 years.  You may need to have your cholesterol levels checked more often if: ? Your lipid or cholesterol levels are high. ? You are older than 58 years of age. ? You are at high risk for heart disease.  Cancer screening Lung Cancer  Lung cancer screening is recommended for adults 51-40 years old who are at high risk for lung cancer because of a history of smoking.  A yearly low-dose CT scan of the lungs is recommended for people who: ? Currently smoke. ? Have quit within the past 15 years. ? Have at least a 30-pack-year history of smoking. A pack year is smoking an average of one pack of cigarettes a day for 1 year.  Yearly screening should continue until it has been 15 years since you quit.  Yearly screening should stop if you develop a health problem that would prevent you from having lung cancer treatment.  Breast Cancer  Practice breast self-awareness. This means understanding how your breasts normally appear and feel.  It also means doing regular breast self-exams. Let your health care provider know about any changes, no matter how small.  If you are in your 20s or 30s, you should have a clinical breast exam (CBE) by a health care provider every 1-3 years as part of a regular health exam.  If you are 19 or older, have a CBE every year. Also consider having a breast X-ray (mammogram) every year.  If you have a family history of breast cancer, talk to your health care provider about genetic screening.  If you are at high risk for breast cancer, talk to your health care provider about having an MRI and a mammogram every year.  Breast cancer gene (BRCA) assessment is recommended for women who have family members with BRCA-related cancers. BRCA-related cancers include: ? Breast. ? Ovarian. ? Tubal. ? Peritoneal cancers.  Results of the assessment will determine the  need for genetic counseling and BRCA1 and BRCA2 testing.  Cervical Cancer Your health care provider may recommend that you be screened regularly for cancer of the pelvic organs (ovaries, uterus, and vagina). This screening involves a pelvic examination, including checking for microscopic changes to the surface of your cervix (Pap test). You may be encouraged to have this screening done every 3 years, beginning at age 28.  For women ages 8-65, health care providers may recommend pelvic exams and Pap testing every 3 years, or they may recommend the Pap and pelvic exam, combined with testing for human papilloma virus (HPV), every 5 years. Some types of HPV increase your risk of cervical cancer. Testing for HPV may also be done on women of any age with unclear Pap test results.  Other health care providers may not recommend  any screening for nonpregnant women who are considered low risk for pelvic cancer and who do not have symptoms. Ask your health care provider if a screening pelvic exam is right for you.  If you have had past treatment for cervical cancer or a condition that could lead to cancer, you need Pap tests and screening for cancer for at least 20 years after your treatment. If Pap tests have been discontinued, your risk factors (such as having a new sexual partner) need to be reassessed to determine if screening should resume. Some women have medical problems that increase the chance of getting cervical cancer. In these cases, your health care provider may recommend more frequent screening and Pap tests.  Colorectal Cancer  This type of cancer can be detected and often prevented.  Routine colorectal cancer screening usually begins at 58 years of age and continues through 58 years of age.  Your health care provider may recommend screening at an earlier age if you have risk factors for colon cancer.  Your health care provider may also recommend using home test kits to check for hidden blood  in the stool.  A small camera at the end of a tube can be used to examine your colon directly (sigmoidoscopy or colonoscopy). This is done to check for the earliest forms of colorectal cancer.  Routine screening usually begins at age 31.  Direct examination of the colon should be repeated every 5-10 years through 58 years of age. However, you may need to be screened more often if early forms of precancerous polyps or small growths are found.  Skin Cancer  Check your skin from head to toe regularly.  Tell your health care provider about any new moles or changes in moles, especially if there is a change in a mole's shape or color.  Also tell your health care provider if you have a mole that is larger than the size of a pencil eraser.  Always use sunscreen. Apply sunscreen liberally and repeatedly throughout the day.  Protect yourself by wearing long sleeves, pants, a wide-brimmed hat, and sunglasses whenever you are outside.  Heart disease, diabetes, and high blood pressure  High blood pressure causes heart disease and increases the risk of stroke. High blood pressure is more likely to develop in: ? People who have blood pressure in the high end of the normal range (130-139/85-89 mm Hg). ? People who are overweight or obese. ? People who are African American.  If you are 82-37 years of age, have your blood pressure checked every 3-5 years. If you are 69 years of age or older, have your blood pressure checked every year. You should have your blood pressure measured twice-once when you are at a hospital or clinic, and once when you are not at a hospital or clinic. Record the average of the two measurements. To check your blood pressure when you are not at a hospital or clinic, you can use: ? An automated blood pressure machine at a pharmacy. ? A home blood pressure monitor.  If you are between 61 years and 80 years old, ask your health care provider if you should take aspirin to prevent  strokes.  Have regular diabetes screenings. This involves taking a blood sample to check your fasting blood sugar level. ? If you are at a normal weight and have a low risk for diabetes, have this test once every three years after 58 years of age. ? If you are overweight and have a high risk  for diabetes, consider being tested at a younger age or more often. Preventing infection Hepatitis B  If you have a higher risk for hepatitis B, you should be screened for this virus. You are considered at high risk for hepatitis B if: ? You were born in a country where hepatitis B is common. Ask your health care provider which countries are considered high risk. ? Your parents were born in a high-risk country, and you have not been immunized against hepatitis B (hepatitis B vaccine). ? You have HIV or AIDS. ? You use needles to inject street drugs. ? You live with someone who has hepatitis B. ? You have had sex with someone who has hepatitis B. ? You get hemodialysis treatment. ? You take certain medicines for conditions, including cancer, organ transplantation, and autoimmune conditions.  Hepatitis C  Blood testing is recommended for: ? Everyone born from [birth year] through 1966. ? Anyone with known risk factors for hepatitis C.  Sexually transmitted infections (STIs)  You should be screened for sexually transmitted infections (STIs) including gonorrhea and chlamydia if: ? You are sexually active and are younger than 58 years of age. ? You are older than 58 years of age and your health care provider tells you that you are at risk for this type of infection. ? Your sexual activity has changed since you were last screened and you are at an increased risk for chlamydia or gonorrhea. Ask your health care provider if you are at risk.  If you do not have HIV, but are at risk, it may be recommended that you take a prescription medicine daily to prevent HIV infection. This is called pre-exposure prophylaxis  (PrEP). You are considered at risk if: ? You are sexually active and do not regularly use condoms or know the HIV status of your partner(s). ? You take drugs by injection. ? You are sexually active with a partner who has HIV.  Talk with your health care provider about whether you are at high risk of being infected with HIV. If you choose to begin PrEP, you should first be tested for HIV. You should then be tested every 3 months for as long as you are taking PrEP. Pregnancy  If you are premenopausal and you may become pregnant, ask your health care provider about preconception counseling.  If you may become pregnant, take 400 to 800 micrograms (mcg) of folic acid every day.  If you want to prevent pregnancy, talk to your health care provider about birth control (contraception). Osteoporosis and menopause  Osteoporosis is a disease in which the bones lose minerals and strength with aging. This can result in serious bone fractures. Your risk for osteoporosis can be identified using a bone density scan.  If you are 23 years of age or older, or if you are at risk for osteoporosis and fractures, ask your health care provider if you should be screened.  Ask your health care provider whether you should take a calcium or vitamin D supplement to lower your risk for osteoporosis.  Menopause may have certain physical symptoms and risks.  Hormone replacement therapy may reduce some of these symptoms and risks. Talk to your health care provider about whether hormone replacement therapy is right for you. Follow these instructions at home:  Schedule regular health, dental, and eye exams.  Stay current with your immunizations.  Do not use any tobacco products including cigarettes, chewing tobacco, or electronic cigarettes.  If you are pregnant, do not drink alcohol.  If you are breastfeeding, limit how much and how often you drink alcohol.  Limit alcohol intake to no more than 1 drink per day for  nonpregnant women. One drink equals 12 ounces of beer, 5 ounces of wine, or 1 ounces of hard liquor.  Do not use street drugs.  Do not share needles.  Ask your health care provider for help if you need support or information about quitting drugs.  Tell your health care provider if you often feel depressed.  Tell your health care provider if you have ever been abused or do not feel safe at home. This information is not intended to replace advice given to you by your health care provider. Make sure you discuss any questions you have with your health care provider. Document Released: 06/01/2011 Document Revised: 04/23/2016 Document Reviewed: 08/20/2015 Elsevier Interactive Patient Education  2018 [place]. Josilyn Shippee M.D.

## 2018-11-02 ENCOUNTER — Ambulatory Visit (INDEPENDENT_AMBULATORY_CARE_PROVIDER_SITE_OTHER): Payer: 59 | Admitting: Internal Medicine

## 2018-11-02 ENCOUNTER — Encounter: Payer: Self-pay | Admitting: Internal Medicine

## 2018-11-02 VITALS — BP 118/68 | HR 78 | Temp 98.2°F | Ht 59.84 in | Wt 174.8 lb

## 2018-11-02 DIAGNOSIS — Z0000 Encounter for general adult medical examination without abnormal findings: Secondary | ICD-10-CM

## 2018-11-02 DIAGNOSIS — E785 Hyperlipidemia, unspecified: Secondary | ICD-10-CM | POA: Diagnosis not present

## 2018-11-02 DIAGNOSIS — E039 Hypothyroidism, unspecified: Secondary | ICD-10-CM | POA: Diagnosis not present

## 2018-11-02 DIAGNOSIS — R635 Abnormal weight gain: Secondary | ICD-10-CM

## 2018-11-02 DIAGNOSIS — Z79899 Other long term (current) drug therapy: Secondary | ICD-10-CM

## 2018-11-02 DIAGNOSIS — R739 Hyperglycemia, unspecified: Secondary | ICD-10-CM | POA: Diagnosis not present

## 2018-11-02 DIAGNOSIS — Z833 Family history of diabetes mellitus: Secondary | ICD-10-CM

## 2018-11-02 MED ORDER — SIMVASTATIN 40 MG PO TABS: 40.0000 mg | ORAL_TABLET | Freq: Every day | ORAL | 1 refills | Status: DC

## 2018-11-02 NOTE — Patient Instructions (Addendum)
consider adding  Metformin    Because of  Blood sugar risen. . Tracking   Intake sleep and exercise .  consider a prediabetic program.   Can do pap next year  Last was normal with  Negative   Hi risk HPV.  fwr 7-8 hours sleep .  No eating after 7 pm  Make a plan for  Food intack  Most of calories in med day . Water instead of other veverages .  Consider counseling if stuck  In grief and  Mood .    ROV  in 3-4 months for [place] ot check the a1c .  And lipids at some point .   Increase  The simvastatin to 40 mg per day .  In the interim .   Avoid processed carrbs       In the interim   .    Health Maintenance, Female Adopting a healthy lifestyle and getting preventive care can go a long way to promote health and wellness. Talk with your health care provider about what schedule of regular examinations is right for you. This is a good chance for you to check in with your provider about disease prevention and staying healthy. In between checkups, there are plenty of things you can do on your own. Experts have done a lot of research about which lifestyle changes and preventive measures are most likely to keep you healthy. Ask your health care provider for more information. Weight and diet Eat a healthy diet  Be sure to include plenty of vegetables, fruits, low-fat dairy products, and lean protein.  Do not eat a lot of foods high in solid fats, added sugars, or salt.  Get regular exercise. This is one of the most important things you can do for your health. ? Most adults should exercise for at least 150 minutes each week. The exercise should increase your heart rate and make you sweat (moderate-intensity exercise). ? Most adults should also do strengthening exercises at least twice a week. This is in addition to the moderate-intensity exercise.  Maintain a healthy weight  Body mass index (BMI) is a measurement that can be used to identify possible weight problems. It estimates body fat based  on height and weight. Your health care provider can help determine your BMI and help you achieve or maintain a healthy weight.  For females 64 years of age and older: ? A BMI below 18.5 is considered underweight. ? A BMI of 18.5 to 24.9 is normal. ? A BMI of 25 to 29.9 is considered overweight. ? A BMI of 30 and above is considered obese.  Watch levels of cholesterol and blood lipids  You should start having your blood tested for lipids and cholesterol at 58 years of age, then have this test every 5 years.  You may need to have your cholesterol levels checked more often if: ? Your lipid or cholesterol levels are high. ? You are older than 58 years of age. ? You are at high risk for heart disease.  Cancer screening Lung Cancer  Lung cancer screening is recommended for adults 14-67 years old who are at high risk for lung cancer because of a history of smoking.  A yearly low-dose CT scan of the lungs is recommended for people who: ? Currently smoke. ? Have quit within the past 15 years. ? Have at least a 30-pack-year history of smoking. A pack year is smoking an average of one pack of cigarettes a day for 1 year.  Yearly screening should continue until it has been 15 years since you quit.  Yearly screening should stop if you develop a health problem that would prevent you from having lung cancer treatment.  Breast Cancer  Practice breast self-awareness. This means understanding how your breasts normally appear and feel.  It also means doing regular breast self-exams. Let your health care provider know about any changes, no matter how small.  If you are in your 20s or 30s, you should have a clinical breast exam (CBE) by a health care provider every 1-3 years as part of a regular health exam.  If you are 19 or older, have a CBE every year. Also consider having a breast X-ray (mammogram) every year.  If you have a family history of breast cancer, talk to your health care provider  about genetic screening.  If you are at high risk for breast cancer, talk to your health care provider about having an MRI and a mammogram every year.  Breast cancer gene (BRCA) assessment is recommended for women who have family members with BRCA-related cancers. BRCA-related cancers include: ? Breast. ? Ovarian. ? Tubal. ? Peritoneal cancers.  Results of the assessment will determine the need for genetic counseling and BRCA1 and BRCA2 testing.  Cervical Cancer Your health care provider may recommend that you be screened regularly for cancer of the pelvic organs (ovaries, uterus, and vagina). This screening involves a pelvic examination, including checking for microscopic changes to the surface of your cervix (Pap test). You may be encouraged to have this screening done every 3 years, beginning at age 50.  For women ages 39-65, health care providers may recommend pelvic exams and Pap testing every 3 years, or they may recommend the Pap and pelvic exam, combined with testing for human papilloma virus (HPV), every 5 years. Some types of HPV increase your risk of cervical cancer. Testing for HPV may also be done on women of any age with unclear Pap test results.  Other health care providers may not recommend any screening for nonpregnant women who are considered low risk for pelvic cancer and who do not have symptoms. Ask your health care provider if a screening pelvic exam is right for you.  If you have had past treatment for cervical cancer or a condition that could lead to cancer, you need Pap tests and screening for cancer for at least 20 years after your treatment. If Pap tests have been discontinued, your risk factors (such as having a new sexual partner) need to be reassessed to determine if screening should resume. Some women have medical problems that increase the chance of getting cervical cancer. In these cases, your health care provider may recommend more frequent screening and Pap  tests.  Colorectal Cancer  This type of cancer can be detected and often prevented.  Routine colorectal cancer screening usually begins at 58 years of age and continues through 59 years of age.  Your health care provider may recommend screening at an earlier age if you have risk factors for colon cancer.  Your health care provider may also recommend using home test kits to check for hidden blood in the stool.  A small camera at the end of a tube can be used to examine your colon directly (sigmoidoscopy or colonoscopy). This is done to check for the earliest forms of colorectal cancer.  Routine screening usually begins at age 75.  Direct examination of the colon should be repeated every 5-10 years through 58 years of age.  However, you may need to be screened more often if early forms of precancerous polyps or small growths are found.  Skin Cancer  Check your skin from head to toe regularly.  Tell your health care provider about any new moles or changes in moles, especially if there is a change in a mole's shape or color.  Also tell your health care provider if you have a mole that is larger than the size of a pencil eraser.  Always use sunscreen. Apply sunscreen liberally and repeatedly throughout the day.  Protect yourself by wearing long sleeves, pants, a wide-brimmed hat, and sunglasses whenever you are outside.  Heart disease, diabetes, and high blood pressure  High blood pressure causes heart disease and increases the risk of stroke. High blood pressure is more likely to develop in: ? People who have blood pressure in the high end of the normal range (130-139/85-89 mm Hg). ? People who are overweight or obese. ? People who are African American.  If you are 44-84 years of age, have your blood pressure checked every 3-5 years. If you are 62 years of age or older, have your blood pressure checked every year. You should have your blood pressure measured twice-once when you are at  a hospital or clinic, and once when you are not at a hospital or clinic. Record the average of the two measurements. To check your blood pressure when you are not at a hospital or clinic, you can use: ? An automated blood pressure machine at a pharmacy. ? A home blood pressure monitor.  If you are between 83 years and 101 years old, ask your health care provider if you should take aspirin to prevent strokes.  Have regular diabetes screenings. This involves taking a blood sample to check your fasting blood sugar level. ? If you are at a normal weight and have a low risk for diabetes, have this test once every three years after 58 years of age. ? If you are overweight and have a high risk for diabetes, consider being tested at a younger age or more often. Preventing infection Hepatitis B  If you have a higher risk for hepatitis B, you should be screened for this virus. You are considered at high risk for hepatitis B if: ? You were born in a country where hepatitis B is common. Ask your health care provider which countries are considered high risk. ? Your parents were born in a high-risk country, and you have not been immunized against hepatitis B (hepatitis B vaccine). ? You have HIV or AIDS. ? You use needles to inject street drugs. ? You live with someone who has hepatitis B. ? You have had sex with someone who has hepatitis B. ? You get hemodialysis treatment. ? You take certain medicines for conditions, including cancer, organ transplantation, and autoimmune conditions.  Hepatitis C  Blood testing is recommended for: ? Everyone born from [birth year] through 1966. ? Anyone with known risk factors for hepatitis C.  Sexually transmitted infections (STIs)  You should be screened for sexually transmitted infections (STIs) including gonorrhea and chlamydia if: ? You are sexually active and are younger than 58 years of age. ? You are older than 58 years of age and your health care provider tells  you that you are at risk for this type of infection. ? Your sexual activity has changed since you were last screened and you are at an increased risk for chlamydia or gonorrhea. Ask your health care provider if  you are at risk.  If you do not have HIV, but are at risk, it may be recommended that you take a prescription medicine daily to prevent HIV infection. This is called pre-exposure prophylaxis (PrEP). You are considered at risk if: ? You are sexually active and do not regularly use condoms or know the HIV status of your partner(s). ? You take drugs by injection. ? You are sexually active with a partner who has HIV.  Talk with your health care provider about whether you are at high risk of being infected with HIV. If you choose to begin PrEP, you should first be tested for HIV. You should then be tested every 3 months for as long as you are taking PrEP. Pregnancy  If you are premenopausal and you may become pregnant, ask your health care provider about preconception counseling.  If you may become pregnant, take 400 to 800 micrograms (mcg) of folic acid every day.  If you want to prevent pregnancy, talk to your health care provider about birth control (contraception). Osteoporosis and menopause  Osteoporosis is a disease in which the bones lose minerals and strength with aging. This can result in serious bone fractures. Your risk for osteoporosis can be identified using a bone density scan.  If you are 47 years of age or older, or if you are at risk for osteoporosis and fractures, ask your health care provider if you should be screened.  Ask your health care provider whether you should take a calcium or vitamin D supplement to lower your risk for osteoporosis.  Menopause may have certain physical symptoms and risks.  Hormone replacement therapy may reduce some of these symptoms and risks. Talk to your health care provider about whether hormone replacement therapy is right for  you. Follow these instructions at home:  Schedule regular health, dental, and eye exams.  Stay current with your immunizations.  Do not use any tobacco products including cigarettes, chewing tobacco, or electronic cigarettes.  If you are pregnant, do not drink alcohol.  If you are breastfeeding, limit how much and how often you drink alcohol.  Limit alcohol intake to no more than 1 drink per day for nonpregnant women. One drink equals 12 ounces of beer, 5 ounces of wine, or 1 ounces of hard liquor.  Do not use street drugs.  Do not share needles.  Ask your health care provider for help if you need support or information about quitting drugs.  Tell your health care provider if you often feel depressed.  Tell your health care provider if you have ever been abused or do not feel safe at home. This information is not intended to replace advice given to you by your health care provider. Make sure you discuss any questions you have with your health care provider. Document Released: 06/01/2011 Document Revised: 04/23/2016 Document Reviewed: 08/20/2015 Elsevier Interactive Patient Education  [employer].

## 2018-11-21 DIAGNOSIS — Z1231 Encounter for screening mammogram for malignant neoplasm of breast: Secondary | ICD-10-CM | POA: Diagnosis not present

## 2018-11-21 LAB — HM MAMMOGRAPHY

## 2018-12-02 NOTE — Progress Notes (Signed)
Chief Complaint  Patient presents with  . Cough    started on december 28th. Scratchy thoart and sinus congestion. Non producive cough. Ears are hurting and sinus pressure.    HPI: Sarah Floyd 58 y.o. come in for    Tried Claritin.   After working   Outside   But then cough interfering with cough   Stayed out of work dec 30  Like a cold and then  [employer] back to work  But  3 days ago  Felt off balance  And  pressue  Right teeth near sinus   .  So called for appt before weekend.  Took dayquil nyquil  helps cough blowing nose . Clear  Using   Organic  Nose    pressure got better  .  She is a lot better today but still off some   Kept appt     ROS: See pertinent positives and negatives per HPI. No cp sob chills   Current face pain.   Past Medical History:  Diagnosis Date  . Hematuria    Dr Joelyn Oms 2001 Trigonitis. Ultrasound and cystoscopy  . Hormone replacement therapy (HRT) 08/30/2013  . Hyperlipidemia   . Hypothyroid    Sp ablation  on meds per Dr Chalmers Cater    Family History  Problem Relation Age of Onset  . Diabetes Unknown   . Hyperlipidemia Unknown   . Heart attack Unknown 73       father  . Thyroid disease Unknown        mom  . Diabetes Mother     Social History   Socioeconomic History  . Marital status: Single    Spouse name: Not on file  . Number of children: Not on file  . Years of education: Not on file  . Highest education level: Not on file  Occupational History  . Not on file  Social Needs  . Financial resource strain: Not on file  . Food insecurity:    Worry: Not on file    Inability: Not on file  . Transportation needs:    Medical: Not on file    Non-medical: Not on file  Tobacco Use  . Smoking status: Former Research scientist (life sciences)  . Smokeless tobacco: Never Used  Substance and Sexual Activity  . Alcohol use: No  . Drug use: No  . Sexual activity: Not on file  Lifestyle  . Physical activity:    Days per week: Not on file    Minutes per session: Not on file    . Stress: Not on file  Relationships  . Social connections:    Talks on phone: Not on file    Gets together: Not on file    Attends religious service: Not on file    Active member of club or organization: Not on file    Attends meetings of clubs or organizations: Not on file    Relationship status: Not on file  Other Topics Concern  . Not on file  Social History Narrative   hh 1  +   Pet dog  Passed in June 15    Exercise  No etoh.   Sleep  7-8 hours.      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 CALCIUM CITRATE-VITAMIN D PO Take by mouth. 1000 MG OF Calcium and 500 iu  of vitamin D    . Krill Oil 300 MG CAPS Take 100 mg by mouth daily.     . Multiple Vitamin (MULTIVITAMIN) tablet Take 1 tablet by mouth daily.      . Psyllium (METAMUCIL PO) 1 TSP twice daily    . simvastatin (ZOCOR) 40 MG tablet Take 1 tablet (40 mg total) by mouth at bedtime. 90 tablet 1  . SYNTHROID 100 MCG tablet TAKE 1 TABLET DAILY 90 tablet 0  . Turmeric POWD by Does not apply route. 1 scoop daily     No facility-administered medications prior to visit.      EXAM:  BP 120/72 (BP Location: Right Arm, Patient Position: Sitting, Cuff Size: Large)   Pulse 72   Temp 98.2 F (36.8 C) (Oral)   Wt 171 lb 9.6 oz (77.8 kg)   LMP 05/30/2012   BMI 33.69 kg/m   Body mass index is 33.69 kg/m. WDWN in NAD  quiet respirations; nl speech . Non toxic . HEENT: Normocephalic ;atraumatic , Eyes;  PERRL, EOMs  Full, lids and conjunctiva clear,,Ears: no deformities, canals nl, TM landmarks normal, Nose: no deformity or discharge but congested;face non tender Mouth : OP clear without lesion or edema . Neck: Supple without adenopathy or masses or bruits Chest:  Clear to A&P without wheezes rales or rhonchi CV:  S1-S2 no gallops or murmurs peripheral perfusion is normal Skin :nl perfusion and no  acute rashes  Neuro non focal   BP Readings from Last 3 Encounters:  12/05/18 120/72  11/02/18 118/68  07/04/18 108/70    ASSESSMENT AND PLAN:  Discussed the following assessment and plan:  Upper respiratory tract infection, unspecified type  Acute recurrent sinusitis, unspecified location - improved with time and concservative care over weekend poss underlying allergy  Exam is reassuring today and seems to have some chronic sinus congestion worse  w uri and sinus pressure fright now   Improving   Expectant management. And  rx  And   Contact us if relapsing alarm sx  -Patient advised to return or notify health care team  if  new concerns arise.  Patient Instructions   I think this is a viral respiratory infection   With sinus involvement .     Exam is reassuring  Ears nl .   If sinus pain pressure .   And  Balance issues   recurr .   Or not right   Ina week or pain   Unrelenting  .  Then contact us.   Saline  Nasal is good .Marland Kitchen   If needed add flonase nasacort . For  Chronic  sinus  Inflammation.     Standley Brooking. Panosh M.D.

## 2018-12-05 ENCOUNTER — Encounter: Payer: Self-pay | Admitting: Internal Medicine

## 2018-12-05 ENCOUNTER — Ambulatory Visit (INDEPENDENT_AMBULATORY_CARE_PROVIDER_SITE_OTHER): Payer: 59 | Admitting: Internal Medicine

## 2018-12-05 VITALS — BP 120/72 | HR 72 | Temp 98.2°F | Wt 171.6 lb

## 2018-12-05 DIAGNOSIS — J069 Acute upper respiratory infection, unspecified: Secondary | ICD-10-CM | POA: Diagnosis not present

## 2018-12-05 DIAGNOSIS — J0191 Acute recurrent sinusitis, unspecified: Secondary | ICD-10-CM

## 2018-12-05 NOTE — Patient Instructions (Signed)
  I think this is a viral respiratory infection   With sinus involvement .     Exam is reassuring  Ears nl .   If sinus pain pressure .   And  Balance issues   recurr .   Or not right   Ina week or pain   Unrelenting  .  Then contact us.   Saline  Nasal is good .Marland Kitchen   If needed add flonase nasacort . For  Chronic  sinus  Inflammation.

## 2018-12-06 ENCOUNTER — Other Ambulatory Visit: Payer: Self-pay | Admitting: Internal Medicine

## 2018-12-14 ENCOUNTER — Encounter: Payer: Self-pay | Admitting: Internal Medicine

## 2018-12-23 ENCOUNTER — Other Ambulatory Visit: Payer: Self-pay | Admitting: Internal Medicine

## 2019-01-05 ENCOUNTER — Other Ambulatory Visit: Payer: Self-pay | Admitting: Internal Medicine

## 2019-01-05 NOTE — Telephone Encounter (Signed)
Last refill ordered on 11/02/18 for 90 tabs with 1 refill. He should not need refill through May.

## 2019-01-05 NOTE — Telephone Encounter (Signed)
Copied from [place] [id number]. Topic: Quick Communication - Rx Refill/Question >> Jan 05, 2019 10:12 AM Antonieta Iba C wrote: Medication: simvastatin (ZOCOR) 40 MG tablet   Has the patient contacted their pharmacy? Yes  (Agent: If no, request that the patient contact the pharmacy for the refill.) (Agent: If yes, when and what did the pharmacy advise?)  Preferred Pharmacy (with phone number or street name): [facility], [place] [facility] [phone] (Phone) [phone] (Fax)    Agent: Please be advised that RX refills may take up to 3 business days. We ask that you follow-up with your pharmacy.

## 2019-03-01 ENCOUNTER — Other Ambulatory Visit: Payer: Self-pay | Admitting: Internal Medicine

## 2019-03-07 ENCOUNTER — Ambulatory Visit: Payer: 59 | Admitting: Internal Medicine

## 2019-06-05 ENCOUNTER — Other Ambulatory Visit: Payer: Self-pay | Admitting: Internal Medicine

## 2019-08-13 ENCOUNTER — Other Ambulatory Visit: Payer: Self-pay | Admitting: Internal Medicine

## 2019-08-28 ENCOUNTER — Other Ambulatory Visit: Payer: Self-pay | Admitting: Internal Medicine

## 2019-09-12 ENCOUNTER — Ambulatory Visit (INDEPENDENT_AMBULATORY_CARE_PROVIDER_SITE_OTHER): Payer: 59

## 2019-09-12 ENCOUNTER — Other Ambulatory Visit: Payer: Self-pay

## 2019-09-12 DIAGNOSIS — Z23 Encounter for immunization: Secondary | ICD-10-CM | POA: Diagnosis not present

## 2019-11-19 ENCOUNTER — Other Ambulatory Visit: Payer: Self-pay | Admitting: Internal Medicine

## 2019-11-27 LAB — HM MAMMOGRAPHY

## 2019-12-07 ENCOUNTER — Encounter: Payer: Self-pay | Admitting: Internal Medicine

## 2020-03-04 ENCOUNTER — Other Ambulatory Visit: Payer: Self-pay

## 2020-03-04 ENCOUNTER — Telehealth: Payer: Self-pay | Admitting: Internal Medicine

## 2020-03-04 MED ORDER — LEVOTHYROXINE SODIUM 100 MCG PO TABS: 100.0000 ug | ORAL_TABLET | Freq: Every day | ORAL | 0 refills | Status: DC

## 2020-03-04 NOTE — Telephone Encounter (Signed)
Medication has been sent to [facility] with message for patient to keep upcoming appt on 03/13/2020.

## 2020-03-04 NOTE — Telephone Encounter (Signed)
SYNTHROID 100 MCG tablet  REFERENCE #: [id number]  [facility] [place], [place] to [facility]istered [place] [facility] Phone:  [phone]  Fax:  [phone]

## 2020-03-12 ENCOUNTER — Other Ambulatory Visit: Payer: Self-pay

## 2020-03-12 NOTE — Progress Notes (Signed)
Chief Complaint  Patient presents with  . Annual Exam    Doing well  . Gynecologic Exam    HPI: Patient  Sarah Floyd  59 y.o. comes in today for Preventive Health Care visit  And med evaluation  Since last years she had done a weigh tloss octavia program and lost over 30# and feels better in regard to achiness  And energy    Thyroid  ? If off cause lost  weight   Has some dec  Eye brows. laterally and puffy eyes   Same brand and take without other meds or food    Up to  simva   40 mg  last year   Wonders about dec dose or so   Health Maintenance  Topic Date Due  . Hepatitis C Screening  Never done  . PNEUMOCOCCAL POLYSACCHARIDE VACCINE AGE 49-64 HIGH RISK  Never done  . FOOT EXAM  Never done  . URINE MICROALBUMIN  Never done  . HIV Screening  Never done  . PAP SMEAR-Modifier  10/01/2018  . OPHTHALMOLOGY EXAM  03/01/2019  . HEMOGLOBIN A1C  04/24/2019  . INFLUENZA VACCINE  06/30/2020  . COLONOSCOPY  04/07/2021  . MAMMOGRAM  11/26/2021  . TETANUS/TDAP  10/01/2025   Health Maintenance Review LIFESTYLE:  Exercise:   Qt least 30 minutes  Not as good as pre covid  Tobacco/ETS:  no Alcohol:  Not much  Sugar beverages: none  Sleep: 7-8  Drug use: no HH of  1 no pets  Work: from home  Until sept 10 hours   No vag bleeding   Menopause  2013  About   ROS:  GEN/ HEENT: No fever, significant weight changes sweats headaches vision problems hearing changes, CV/ PULM; No chest pain shortness of breath cough, syncope,edema  change in exercise tolerance. GI /GU: No adominal pain, vomiting, change in bowel habits. No blood in the stool. No significant GU symptoms. SKIN/HEME: ,no acute skin rashes suspicious lesions or bleeding. No lymphadenopathy, nodules, masses.  NEURO/ PSYCH:  No neurologic signs such as weakness numbness. No depression anxiety. IMM/ Allergy: No unusual infections.  Allergy .   REST of 12 system review negative except as per HPI   Past Medical History:   Diagnosis Date  . Hematuria    Dr Joelyn Oms 2001 Trigonitis. Ultrasound and cystoscopy  . Hormone replacement therapy (HRT) 08/30/2013  . Hyperlipidemia   . Hypothyroid    Sp ablation  on meds per Dr Chalmers Cater    Past Surgical History:  Procedure Laterality Date  . LIPOMA EXCISION     left buttock 3 cm    Family History  Problem Relation Age of Onset  . Diabetes Mother   . Diabetes Other   . Hyperlipidemia Other   . Heart attack Other 77       father  . Thyroid disease Other        mom    Social History   Socioeconomic History  . Marital status: Single    Spouse name: Not on file  . Number of children: Not on file  . Years of education: Not on file  . Highest education level: Not on file  Occupational History  . Not on file  Tobacco Use  . Smoking status: Former Research scientist (life sciences)  . Smokeless tobacco: Never Used  Substance and Sexual Activity  . Alcohol use: No  . Drug use: No  . Sexual activity: Not on file  Other Topics Concern  . Not on  file  Social History Narrative   hh 1  +   Pet dog  Passed in June 15    Exercise  No etoh.   Sleep  7-8 hours.      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    Social Determinants of Health   Financial Resource Strain:   . Difficulty of Paying Living Expenses:   Food Insecurity:   . Worried About Charity fundraiser in the Last Year:   . Arboriculturist in the Last Year:   Transportation Needs:   . Film/video editor (Medical):   Marland Kitchen Lack of Transportation (Non-Medical):   Physical Activity:   . Days of Exercise per Week:   . Minutes of Exercise per Session:   Stress:   . Feeling of Stress :   Social Connections:   . Frequency of Communication with Friends and Family:   . Frequency of Social Gatherings with Friends and Family:   . Attends Religious Services:   . Active Member of Clubs or Organizations:   . Attends Archivist Meetings:   Marland Kitchen Marital Status:      Outpatient Medications Prior to Visit  Medication Sig Dispense Refill  . CALCIUM CITRATE-VITAMIN D PO Take by mouth. 1000 MG OF Calcium and 500 iu of vitamin D    . levothyroxine (SYNTHROID) 100 MCG tablet Take 1 tablet (100 mcg total) by mouth daily. Keep appt on 03/13/2020 for further refills. 90 tablet 0  . Multiple Vitamin (MULTIVITAMIN) tablet Take 1 tablet by mouth daily.      . Psyllium (METAMUCIL PO) 1 TSP as needed    . simvastatin (ZOCOR) 40 MG tablet TAKE 1 TABLET AT BEDTIME 90 tablet 1  . UNABLE TO FIND Med Name: Natural Vitality Magnesium supplement, takes 1 tsp daily    . Krill Oil 300 MG CAPS Take 100 mg by mouth daily.     . Turmeric POWD by Does not apply route. 1 scoop daily     No facility-administered medications prior to visit.     EXAM:  BP 116/72   Pulse 70   Temp (!) 97.4 F (36.3 C) (Temporal)   Ht 5' 0.25" (1.53 m)   Wt 135 lb 12.8 oz (61.6 kg)   LMP 05/30/2012   SpO2 99%   BMI 26.30 kg/m   Body mass index is 26.3 kg/m. Wt Readings from Last 3 Encounters:  03/13/20 135 lb 12.8 oz (61.6 kg)  12/05/18 171 lb 9.6 oz (77.8 kg)  11/02/18 174 lb 12.8 oz (79.3 kg)    Physical Exam: Vital signs reviewed FKC:LEXN is a well-developed well-nourished alert cooperative    who appearsr stated age in no acute distress.  HEENT: normocephalic atraumatic , Eyes: PERRL EOM's full, conjunctiva clearmin proptosis  And puffiness , deformity discharge or tenderness., Ears: no deformity EAC's clear TMs with normal landmarks. Mouth: clear OP, masked  NECK: supple without masses, thyromegaly or bruits. CHEST/PULM:  Clear to auscultation and percussion breath sounds equal no wheeze , rales or rhonchi. No chest wall deformities or tenderness. Breast: normal by inspection . No dimpling, discharge, masses, tenderness or discharge . CV: PMI is nondisplaced, S1 S2 no gallops, murmurs, rubs. Peripheral pulses are full without delay.No JVD .  ABDOMEN: Bowel sounds normal  nontender  No guard or rebound, no hepato splenomegal no CVA tenderness.  No hernia. Extremtities:  No clubbing cyanosis or  edema, no acute joint swelling or redness no focal atrophy NEURO:  Oriented x3, cranial nerves 3-12 appear to be intact, no obvious focal weakness,gait within normal limits no abnormal reflexes or asymmetrical SKIN: No acute rashes normal turgor, color, no bruising or petechiae.  Latera eye brows are sparse  Hair on head lookds nl  PSYCH: Oriented, good eye contact, no obvious depression anxiety, cognition and judgment appear normal. LN: no cervical axillary inguinal adenopathy Pelvic: NL ext GU, labia clear without lesions or rash . Vagina no lesions .Cervix: clear  UTERUS: Neg CMT Adnexa:  clear no masses . PAP done HR hpv    Lab Results  Component Value Date   WBC 7.7 10/24/2018   HGB 13.6 10/24/2018   HCT 41.6 10/24/2018   PLT 227.0 10/24/2018   GLUCOSE 130 (H) 10/24/2018   CHOL 195 10/24/2018   TRIG 225.0 (H) 10/24/2018   HDL 46.00 10/24/2018   LDLDIRECT 124.0 10/24/2018   LDLCALC 126 (H) 11/01/2017   ALT 26 10/24/2018   AST 21 10/24/2018   NA 140 10/24/2018   K 4.2 10/24/2018   CL 103 10/24/2018   CREATININE 0.84 10/24/2018   BUN 13 10/24/2018   CO2 29 10/24/2018   TSH 0.51 10/24/2018   HGBA1C 6.5 10/24/2018    BP Readings from Last 3 Encounters:  03/13/20 116/72  12/05/18 120/72  11/02/18 118/68   Fasting water  .  Lab results reviewed with patient   ASSESSMENT AND PLAN:  Discussed the following assessment and plan:    ICD-10-CM   1. Visit for preventive health examination  Z61.09 Basic metabolic panel    CBC with Differential/Platelet    Hemoglobin A1c    Hepatic function panel    Lipid panel    TSH    T4, free  2. Encounter for gynecological examination without abnormal finding  Z01.419   3. Medication management  U04.540 Basic metabolic panel    CBC with Differential/Platelet    Hemoglobin A1c    Hepatic function panel    Lipid  panel    TSH    T4, free  4. Hyperlipidemia, unspecified hyperlipidemia type  J81.1 Basic metabolic panel    CBC with Differential/Platelet    Hemoglobin A1c    Hepatic function panel    Lipid panel    TSH    T4, free   lab pending  5. Hypothyroidism, unspecified type  B14.7 Basic metabolic panel    CBC with Differential/Platelet    Hemoglobin A1c    Hepatic function panel    Lipid panel    TSH    T4, free   sp ablation  6. Hyperglycemia  W29.5 Basic metabolic panel    CBC with Differential/Platelet    Hemoglobin A1c    Hepatic function panel    Lipid panel    TSH    T4, free  7. Family history of diabetes mellitus  A21.3 Basic metabolic panel    CBC with Differential/Platelet    Hemoglobin A1c    Hepatic function panel    Lipid panel    TSH    T4, free  8. Osteopenia, unspecified location  M85.80   9. Estrogen deficiency  E28.39    Return in about 1 year (around 03/13/2021) for depending on results, cpx with labs call ahead for orders . Due for dexa scan at [facility]  Last 2017 18  Per dr Chalmers Cater to do every 2 years or so  Will plan on arranging Lab monitoring today  At this time no change in meds until eval   Doing much better on healthy weight loss  plan Return in about 1 year (around 03/13/2021) for depending on results, cpx with labs call ahead for orders .  Patient Care Team: Leovardo Thoman, Standley Brooking, MD as PCP - General Jacelyn Pi, MD (Endocrinology) Milus Banister, MD (Gastroenterology) Patient Instructions  Glad you are doing well  Plan  Labs    Preventive Care 46-31 Years Old, Female Preventive care refers to visits with your health care provider and lifestyle choices that can promote health and wellness. This includes:  A yearly physical exam. This may also be called an annual well check.  Regular dental visits and eye exams.  Immunizations.  Screening for certain conditions.  Healthy lifestyle choices, such as eating a healthy diet, getting regular  exercise, not using drugs or products that contain nicotine and tobacco, and limiting alcohol use. What can I expect for my preventive care visit? Physical exam Your health care provider will check your:  Height and weight. This may be used to calculate body mass index (BMI), which tells if you are at a healthy weight.  Heart rate and blood pressure.  Skin for abnormal spots. Counseling Your health care provider may ask you questions about your:  Alcohol, tobacco, and drug use.  Emotional well-being.  Home and relationship well-being.  Sexual activity.  Eating habits.  Work and work Statistician.  Method of birth control.  Menstrual cycle.  Pregnancy history. What immunizations do I need?  Influenza (flu) vaccine  This is recommended every year. Tetanus, diphtheria, and pertussis (Tdap) vaccine  You may need a Td booster every 10 years. Varicella (chickenpox) vaccine  You may need this if you have not been vaccinated. Zoster (shingles) vaccine  You may need this after age 4. Measles, mumps, and rubella (MMR) vaccine  You may need at least one dose of MMR if you were born in 1957 or later. You may also need a second dose. Pneumococcal conjugate (PCV13) vaccine  You may need this if you have certain conditions and were not previously vaccinated. Pneumococcal polysaccharide (PPSV23) vaccine  You may need one or two doses if you smoke cigarettes or if you have certain conditions. Meningococcal conjugate (MenACWY) vaccine  You may need this if you have certain conditions. Hepatitis A vaccine  You may need this if you have certain conditions or if you travel or work in places where you may be exposed to hepatitis A. Hepatitis B vaccine  You may need this if you have certain conditions or if you travel or work in places where you may be exposed to hepatitis B. Haemophilus influenzae type b (Hib) vaccine  You may need this if you have certain conditions. Human  papillomavirus (HPV) vaccine  If recommended by your health care provider, you may need three doses over 6 months. You may receive vaccines as individual doses or as more than one vaccine together in one shot (combination vaccines). Talk with your health care provider about the risks and benefits of combination vaccines. What tests do I need? Blood tests  Lipid and cholesterol levels. These may be checked every 5 years, or more frequently if you are over 73 years old.  Hepatitis C test.  Hepatitis B test. Screening  Lung cancer screening. You may have this screening every year starting at age 72 if you have a 30-pack-year history of smoking and currently smoke or have quit within the past 15 years.  Colorectal cancer screening. All adults should have this screening starting at age 72 and continuing until age 95. Your health care provider may recommend screening at age 30 if you are at increased risk. You will have tests every 1-10 years, depending on your results and the type of screening test.  Diabetes screening. This is done by checking your blood sugar (glucose) after you have not eaten for a while (fasting). You may have this done every 1-3 years.  Mammogram. This may be done every 1-2 years. Talk with your health care provider about when you should start having regular mammograms. This may depend on whether you have a family history of breast cancer.  BRCA-related cancer screening. This may be done if you have a family history of breast, ovarian, tubal, or peritoneal cancers.  Pelvic exam and Pap test. This may be done every 3 years starting at age 51. Starting at age 48, this may be done every 5 years if you have a Pap test in combination with an HPV test. Other tests  Sexually transmitted disease (STD) testing.  Bone density scan. This is done to screen for osteoporosis. You may have this scan if you are at high risk for osteoporosis. Follow these instructions at home: Eating  and drinking  Eat a diet that includes fresh fruits and vegetables, whole grains, lean protein, and low-fat dairy.  Take vitamin and mineral supplements as recommended by your health care provider.  Do not drink alcohol if: ? Your health care provider tells you not to drink. ? You are pregnant, may be pregnant, or are planning to become pregnant.  If you drink alcohol: ? Limit how much you have to 0-1 drink a day. ? Be aware of how much alcohol is in your drink. In the [place] one drink equals one 12 oz bottle of beer (355 mL), one 5 oz glass of wine (148 mL), or one 1 oz glass of hard liquor (44 mL). Lifestyle  Take daily care of your teeth and gums.  Stay active. Exercise for at least 30 minutes on 5 or more days each week.  Do not use any products that contain nicotine or tobacco, such as cigarettes, e-cigarettes, and chewing tobacco. If you need help quitting, ask your health care provider.  If you are sexually active, practice safe sex. Use a condom or other form of birth control (contraception) in order to prevent pregnancy and STIs (sexually transmitted infections).  If told by your health care provider, take low-dose aspirin daily starting at age 50. What's next?  Visit your health care provider once a year for a well check visit.  Ask your health care provider how often you should have your eyes and teeth checked.  Stay up to date on all vaccines. This information is not intended to replace advice given to you by your health care provider. Make sure you discuss any questions you have with your health care provider. Document Revised: 07/28/2018 Document Reviewed: 07/28/2018 Elsevier Patient Education  2020 [place] Idabelle Mcpeters M.D.

## 2020-03-13 ENCOUNTER — Other Ambulatory Visit (HOSPITAL_COMMUNITY)
Admission: RE | Admit: 2020-03-13 | Discharge: 2020-03-13 | Disposition: A | Discharge status: Home / Self Care | Payer: BC Managed Care – PPO | Source: Ambulatory Visit | Attending: Internal Medicine | Admitting: Internal Medicine

## 2020-03-13 ENCOUNTER — Other Ambulatory Visit: Payer: Self-pay

## 2020-03-13 ENCOUNTER — Ambulatory Visit (INDEPENDENT_AMBULATORY_CARE_PROVIDER_SITE_OTHER): Payer: BC Managed Care – PPO | Admitting: Internal Medicine

## 2020-03-13 ENCOUNTER — Encounter: Payer: Self-pay | Admitting: Internal Medicine

## 2020-03-13 ENCOUNTER — Encounter: Payer: 59 | Admitting: Internal Medicine

## 2020-03-13 VITALS — BP 116/72 | HR 70 | Temp 97.4°F | Ht 60.25 in | Wt 135.8 lb

## 2020-03-13 DIAGNOSIS — E039 Hypothyroidism, unspecified: Secondary | ICD-10-CM

## 2020-03-13 DIAGNOSIS — Z79899 Other long term (current) drug therapy: Secondary | ICD-10-CM

## 2020-03-13 DIAGNOSIS — Z01419 Encounter for gynecological examination (general) (routine) without abnormal findings: Secondary | ICD-10-CM | POA: Diagnosis not present

## 2020-03-13 DIAGNOSIS — E785 Hyperlipidemia, unspecified: Secondary | ICD-10-CM

## 2020-03-13 DIAGNOSIS — Z0000 Encounter for general adult medical examination without abnormal findings: Secondary | ICD-10-CM | POA: Insufficient documentation

## 2020-03-13 DIAGNOSIS — Z833 Family history of diabetes mellitus: Secondary | ICD-10-CM

## 2020-03-13 DIAGNOSIS — E2839 Other primary ovarian failure: Secondary | ICD-10-CM

## 2020-03-13 DIAGNOSIS — R739 Hyperglycemia, unspecified: Secondary | ICD-10-CM

## 2020-03-13 DIAGNOSIS — M8580 Other specified disorders of bone density and structure, unspecified site: Secondary | ICD-10-CM

## 2020-03-13 LAB — BASIC METABOLIC PANEL
BUN: 17 mg/dL (ref 6–23)
CO2: 31 mEq/L (ref 19–32)
Calcium: 9.8 mg/dL (ref 8.4–10.5)
Chloride: 102 mEq/L (ref 96–112)
Creatinine, Ser: 0.74 mg/dL (ref 0.40–1.20)
GFR: 80.12 mL/min (ref >60.00)
Glucose, Bld: 108 mg/dL — ABNORMAL HIGH (ref 70–99)
Potassium: 4.1 mEq/L (ref 3.5–5.1)
Sodium: 139 mEq/L (ref 135–145)

## 2020-03-13 LAB — CBC WITH DIFFERENTIAL/PLATELET
Basophils Absolute: 0.1 10*3/uL (ref 0.0–0.1)
Basophils Relative: 0.8 % (ref 0.0–3.0)
Eosinophils Absolute: 0.2 10*3/uL (ref 0.0–0.7)
Eosinophils Relative: 2.8 % (ref 0.0–5.0)
HCT: 40.8 % (ref 36.0–46.0)
Hemoglobin: 13.3 g/dL (ref 12.0–15.0)
Lymphocytes Relative: 33.8 % (ref 12.0–46.0)
Lymphs Abs: 2.4 10*3/uL (ref 0.7–4.0)
MCHC: 32.7 g/dL (ref 30.0–36.0)
MCV: 92.1 fl (ref 78.0–100.0)
Monocytes Absolute: 0.5 10*3/uL (ref 0.1–1.0)
Monocytes Relative: 7 % (ref 3.0–12.0)
Neutro Abs: 4 10*3/uL (ref 1.4–7.7)
Neutrophils Relative %: 55.6 % (ref 43.0–77.0)
Platelets: 196 10*3/uL (ref 150.0–400.0)
RBC: 4.43 Mil/uL (ref 3.87–5.11)
RDW: 15.2 % (ref 11.5–15.5)
WBC: 7.1 10*3/uL (ref 4.0–10.5)

## 2020-03-13 LAB — HEPATIC FUNCTION PANEL
ALT: 25 U/L (ref 0–35)
AST: 24 U/L (ref 0–37)
Albumin: 4.5 g/dL (ref 3.5–5.2)
Alkaline Phosphatase: 78 U/L (ref 39–117)
Bilirubin, Direct: 0.1 mg/dL (ref 0.0–0.3)
Total Bilirubin: 0.5 mg/dL (ref 0.2–1.2)
Total Protein: 6.8 g/dL (ref 6.0–8.3)

## 2020-03-13 LAB — LIPID PANEL
Cholesterol: 168 mg/dL (ref 0–200)
HDL: 42.1 mg/dL (ref >39.00)
LDL Cholesterol: 110 mg/dL — ABNORMAL HIGH (ref 0–99)
NonHDL: 125.83
Total CHOL/HDL Ratio: 4
Triglycerides: 78 mg/dL (ref 0.0–149.0)
VLDL: 15.6 mg/dL (ref 0.0–40.0)

## 2020-03-13 LAB — HEMOGLOBIN A1C: Hgb A1c MFr Bld: 5.9 % (ref 4.6–6.5)

## 2020-03-13 LAB — TSH: TSH: 0.86 u[IU]/mL (ref 0.35–4.50)

## 2020-03-13 LAB — T4, FREE: Free T4: 1.29 ng/dL (ref 0.60–1.60)

## 2020-03-13 NOTE — Patient Instructions (Signed)
Glad you are doing well  Plan  Labs    Preventive Care 33-60 Years Old, Female Preventive care refers to visits with your health care provider and lifestyle choices that can promote health and wellness. This includes:  A yearly physical exam. This may also be called an annual well check.  Regular dental visits and eye exams.  Immunizations.  Screening for certain conditions.  Healthy lifestyle choices, such as eating a healthy diet, getting regular exercise, not using drugs or products that contain nicotine and tobacco, and limiting alcohol use. What can I expect for my preventive care visit? Physical exam Your health care provider will check your:  Height and weight. This may be used to calculate body mass index (BMI), which tells if you are at a healthy weight.  Heart rate and blood pressure.  Skin for abnormal spots. Counseling Your health care provider may ask you questions about your:  Alcohol, tobacco, and drug use.  Emotional well-being.  Home and relationship well-being.  Sexual activity.  Eating habits.  Work and work Statistician.  Method of birth control.  Menstrual cycle.  Pregnancy history. What immunizations do I need?  Influenza (flu) vaccine  This is recommended every year. Tetanus, diphtheria, and pertussis (Tdap) vaccine  You may need a Td booster every 10 years. Varicella (chickenpox) vaccine  You may need this if you have not been vaccinated. Zoster (shingles) vaccine  You may need this after age 52. Measles, mumps, and rubella (MMR) vaccine  You may need at least one dose of MMR if you were born in 1957 or later. You may also need a second dose. Pneumococcal conjugate (PCV13) vaccine  You may need this if you have certain conditions and were not previously vaccinated. Pneumococcal polysaccharide (PPSV23) vaccine  You may need one or two doses if you smoke cigarettes or if you have certain conditions. Meningococcal conjugate  (MenACWY) vaccine  You may need this if you have certain conditions. Hepatitis A vaccine  You may need this if you have certain conditions or if you travel or work in places where you may be exposed to hepatitis A. Hepatitis B vaccine  You may need this if you have certain conditions or if you travel or work in places where you may be exposed to hepatitis B. Haemophilus influenzae type b (Hib) vaccine  You may need this if you have certain conditions. Human papillomavirus (HPV) vaccine  If recommended by your health care provider, you may need three doses over 6 months. You may receive vaccines as individual doses or as more than one vaccine together in one shot (combination vaccines). Talk with your health care provider about the risks and benefits of combination vaccines. What tests do I need? Blood tests  Lipid and cholesterol levels. These may be checked every 5 years, or more frequently if you are over 79 years old.  Hepatitis C test.  Hepatitis B test. Screening  Lung cancer screening. You may have this screening every year starting at age 34 if you have a 30-pack-year history of smoking and currently smoke or have quit within the past 15 years.  Colorectal cancer screening. All adults should have this screening starting at age 68 and continuing until age 82. Your health care provider may recommend screening at age 81 if you are at increased risk. You will have tests every 1-10 years, depending on your results and the type of screening test.  Diabetes screening. This is done by checking your blood sugar (glucose) after  you have not eaten for a while (fasting). You may have this done every 1-3 years.  Mammogram. This may be done every 1-2 years. Talk with your health care provider about when you should start having regular mammograms. This may depend on whether you have a family history of breast cancer.  BRCA-related cancer screening. This may be done if you have a family  history of breast, ovarian, tubal, or peritoneal cancers.  Pelvic exam and Pap test. This may be done every 3 years starting at age 49. Starting at age 15, this may be done every 5 years if you have a Pap test in combination with an HPV test. Other tests  Sexually transmitted disease (STD) testing.  Bone density scan. This is done to screen for osteoporosis. You may have this scan if you are at high risk for osteoporosis. Follow these instructions at home: Eating and drinking  Eat a diet that includes fresh fruits and vegetables, whole grains, lean protein, and low-fat dairy.  Take vitamin and mineral supplements as recommended by your health care provider.  Do not drink alcohol if: ? Your health care provider tells you not to drink. ? You are pregnant, may be pregnant, or are planning to become pregnant.  If you drink alcohol: ? Limit how much you have to 0-1 drink a day. ? Be aware of how much alcohol is in your drink. In the [place] one drink equals one 12 oz bottle of beer (355 mL), one 5 oz glass of wine (148 mL), or one 1 oz glass of hard liquor (44 mL). Lifestyle  Take daily care of your teeth and gums.  Stay active. Exercise for at least 30 minutes on 5 or more days each week.  Do not use any products that contain nicotine or tobacco, such as cigarettes, e-cigarettes, and chewing tobacco. If you need help quitting, ask your health care provider.  If you are sexually active, practice safe sex. Use a condom or other form of birth control (contraception) in order to prevent pregnancy and STIs (sexually transmitted infections).  If told by your health care provider, take low-dose aspirin daily starting at age 46. What's next?  Visit your health care provider once a year for a well check visit.  Ask your health care provider how often you should have your eyes and teeth checked.  Stay up to date on all vaccines. This information is not intended to replace advice given to you  by your health care provider. Make sure you discuss any questions you have with your health care provider. Document Revised: 07/28/2018 Document Reviewed: 07/28/2018 Elsevier Patient Education  2020 [employer].

## 2020-03-14 ENCOUNTER — Other Ambulatory Visit: Payer: Self-pay

## 2020-03-15 LAB — CYTOLOGY - PAP
Comment: NEGATIVE
Diagnosis: NEGATIVE
High risk HPV: NEGATIVE

## 2020-03-15 MED ORDER — LEVOTHYROXINE SODIUM 100 MCG PO TABS: 100.0000 ug | ORAL_TABLET | Freq: Every day | ORAL | 0 refills | Status: DC

## 2020-03-18 ENCOUNTER — Other Ambulatory Visit: Payer: Self-pay

## 2020-03-18 DIAGNOSIS — E785 Hyperlipidemia, unspecified: Secondary | ICD-10-CM

## 2020-03-18 NOTE — Telephone Encounter (Signed)
Pleas see result note   Ok to fill thyroid dose and give her option if she wants to try 1/2 dose or off medication  And then fu labs

## 2020-03-18 NOTE — Progress Notes (Signed)
Thyroid in range   please send in refoll 90 days x 1 year as  requested  in the  my char t message  cholesterol  and tg are better   You could try off of the simvastatin or take 1/2 dose and see lipid panel in 6 months  if you wish  T

## 2020-03-18 NOTE — Progress Notes (Signed)
Tell patient PAP is normal. HPV high  risk is negative

## 2020-03-19 ENCOUNTER — Other Ambulatory Visit: Payer: Self-pay

## 2020-03-19 MED ORDER — LEVOTHYROXINE SODIUM 100 MCG PO TABS: 100.0000 ug | ORAL_TABLET | Freq: Every day | ORAL | 2 refills | Status: DC

## 2020-04-01 DIAGNOSIS — L820 Inflamed seborrheic keratosis: Secondary | ICD-10-CM | POA: Diagnosis not present

## 2020-04-03 DIAGNOSIS — M8588 Other specified disorders of bone density and structure, other site: Secondary | ICD-10-CM | POA: Diagnosis not present

## 2020-04-03 LAB — HM DEXA SCAN

## 2020-05-20 NOTE — Progress Notes (Signed)
Mild osteopenia  in spine   see scanned document

## 2020-06-24 DIAGNOSIS — D2261 Melanocytic nevi of right upper limb, including shoulder: Secondary | ICD-10-CM | POA: Diagnosis not present

## 2020-06-24 DIAGNOSIS — D2262 Melanocytic nevi of left upper limb, including shoulder: Secondary | ICD-10-CM | POA: Diagnosis not present

## 2020-06-24 DIAGNOSIS — L813 Cafe au lait spots: Secondary | ICD-10-CM | POA: Diagnosis not present

## 2020-06-24 DIAGNOSIS — D2271 Melanocytic nevi of right lower limb, including hip: Secondary | ICD-10-CM | POA: Diagnosis not present

## 2020-09-17 ENCOUNTER — Other Ambulatory Visit: Payer: Self-pay

## 2020-09-17 ENCOUNTER — Other Ambulatory Visit (INDEPENDENT_AMBULATORY_CARE_PROVIDER_SITE_OTHER): Payer: BC Managed Care – PPO

## 2020-09-17 DIAGNOSIS — E785 Hyperlipidemia, unspecified: Secondary | ICD-10-CM

## 2020-09-17 DIAGNOSIS — Z23 Encounter for immunization: Secondary | ICD-10-CM | POA: Diagnosis not present

## 2020-09-18 LAB — LIPID PANEL
Cholesterol: 296 mg/dL — ABNORMAL HIGH (ref <200)
HDL: 69 mg/dL (ref >=50)
LDL Cholesterol (Calc): 203 mg/dL (calc) — ABNORMAL HIGH
Non-HDL Cholesterol (Calc): 227 mg/dL (calc) — ABNORMAL HIGH (ref <130)
Total CHOL/HDL Ratio: 4.3 (calc) (ref <5.0)
Triglycerides: 106 mg/dL (ref <150)

## 2020-09-18 MED ORDER — SIMVASTATIN 40 MG PO TABS: 40.0000 mg | ORAL_TABLET | Freq: Every day | ORAL | 2 refills | Status: DC

## 2020-09-18 NOTE — Telephone Encounter (Signed)
Yes   cholesterol up very high  Will refill simvastatin  to [facility]

## 2020-09-18 NOTE — Progress Notes (Signed)
Lipids levels back up  I sent in simvastatin  refills to restart

## 2020-11-27 DIAGNOSIS — Z1231 Encounter for screening mammogram for malignant neoplasm of breast: Secondary | ICD-10-CM | POA: Diagnosis not present

## 2020-11-27 LAB — HM MAMMOGRAPHY

## 2020-12-10 DIAGNOSIS — R928 Other abnormal and inconclusive findings on diagnostic imaging of breast: Secondary | ICD-10-CM | POA: Diagnosis not present

## 2021-02-28 ENCOUNTER — Other Ambulatory Visit: Payer: Self-pay | Admitting: Internal Medicine

## 2021-05-26 ENCOUNTER — Other Ambulatory Visit: Payer: Self-pay | Admitting: Internal Medicine

## 2021-07-01 DIAGNOSIS — D225 Melanocytic nevi of trunk: Secondary | ICD-10-CM | POA: Diagnosis not present

## 2021-07-01 DIAGNOSIS — L820 Inflamed seborrheic keratosis: Secondary | ICD-10-CM | POA: Diagnosis not present

## 2021-07-01 DIAGNOSIS — L821 Other seborrheic keratosis: Secondary | ICD-10-CM | POA: Diagnosis not present

## 2021-07-01 DIAGNOSIS — D224 Melanocytic nevi of scalp and neck: Secondary | ICD-10-CM | POA: Diagnosis not present

## 2021-07-01 DIAGNOSIS — D2261 Melanocytic nevi of right upper limb, including shoulder: Secondary | ICD-10-CM | POA: Diagnosis not present

## 2021-07-02 ENCOUNTER — Other Ambulatory Visit: Payer: Self-pay | Admitting: Internal Medicine

## 2021-07-16 ENCOUNTER — Other Ambulatory Visit: Payer: Self-pay | Admitting: Internal Medicine

## 2021-07-22 ENCOUNTER — Other Ambulatory Visit: Payer: Self-pay

## 2021-08-01 ENCOUNTER — Other Ambulatory Visit: Payer: Self-pay | Admitting: Internal Medicine

## 2021-08-04 ENCOUNTER — Encounter: Payer: Self-pay | Admitting: Gastroenterology

## 2021-08-15 ENCOUNTER — Other Ambulatory Visit: Payer: Self-pay | Admitting: Internal Medicine

## 2021-08-31 ENCOUNTER — Other Ambulatory Visit: Payer: Self-pay | Admitting: Internal Medicine

## 2021-09-14 ENCOUNTER — Other Ambulatory Visit: Payer: Self-pay | Admitting: Internal Medicine

## 2021-09-15 ENCOUNTER — Encounter: Payer: Self-pay | Admitting: Internal Medicine

## 2021-09-15 ENCOUNTER — Ambulatory Visit: Payer: BC Managed Care – PPO | Admitting: Internal Medicine

## 2021-09-15 ENCOUNTER — Other Ambulatory Visit: Payer: Self-pay

## 2021-09-15 ENCOUNTER — Ambulatory Visit (INDEPENDENT_AMBULATORY_CARE_PROVIDER_SITE_OTHER): Payer: BC Managed Care – PPO | Admitting: Internal Medicine

## 2021-09-15 VITALS — BP 116/70 | HR 72 | Temp 98.6°F | Ht 60.0 in | Wt 162.6 lb

## 2021-09-15 DIAGNOSIS — Z0000 Encounter for general adult medical examination without abnormal findings: Secondary | ICD-10-CM

## 2021-09-15 DIAGNOSIS — Z23 Encounter for immunization: Secondary | ICD-10-CM

## 2021-09-15 DIAGNOSIS — Z79899 Other long term (current) drug therapy: Secondary | ICD-10-CM | POA: Diagnosis not present

## 2021-09-15 DIAGNOSIS — E039 Hypothyroidism, unspecified: Secondary | ICD-10-CM | POA: Diagnosis not present

## 2021-09-15 DIAGNOSIS — E785 Hyperlipidemia, unspecified: Secondary | ICD-10-CM

## 2021-09-15 NOTE — Progress Notes (Signed)
Chief Complaint  Patient presents with   Annual Exam   Medication Management   Hypothyroidism   Hyperlipidemia     HPI: Patient  Sarah Floyd  60 y.o. comes in today for Preventive Health Care visit and medication update  Thyroid   reg use.  No concerns taking Choleterol t taking medication regularly Hot flushes again.  Uncertain why.  Fibroid or other  Colon due.  Dr. Ardis Hughs Needs flu shot today. Health Maintenance  Topic Date Due   FOOT EXAM  Never done   URINE MICROALBUMIN  Never done   HIV Screening  Never done   Hepatitis C Screening  Never done   Zoster Vaccines- Shingrix (1 of 2) Never done   HEMOGLOBIN A1C  09/12/2020   COLONOSCOPY (Pts 45-53yrs Insurance coverage will need to be confirmed)  04/07/2021   COVID-19 Vaccine (5 - Booster for Pfizer series) 08/13/2021   OPHTHALMOLOGY EXAM  07/15/2022   MAMMOGRAM  12/10/2022   PAP SMEAR-Modifier  03/14/2023   TETANUS/TDAP  10/01/2025   INFLUENZA VACCINE  Completed   HPV VACCINES  Aged Out   Health Maintenance Review LIFESTYLE:  Exercise:  not  very good.  Tobacco/ETS: n Alcohol: n Sugar beverages: no No supplement   mag blue  and collagen caclum w d  Sleep: 7-8 hours  sometimes   interrupted . Drug use: no HH of  1 no pets  Work: home work 45 - 50 hours Last bone density t 2021 May mild osteopenia  ROS:  GREST of 12 system review negative except as per HPI   Past Medical History:  Diagnosis Date   Hematuria    Dr Joelyn Oms 2001 Trigonitis. Ultrasound and cystoscopy   Hormone replacement therapy (HRT) 08/30/2013   Hyperlipidemia    Hypothyroid    Sp ablation  on meds per Dr Chalmers Cater    Past Surgical History:  Procedure Laterality Date   LIPOMA EXCISION     left buttock 3 cm    Family History  Problem Relation Age of Onset   Diabetes Mother    Diabetes Other    Hyperlipidemia Other    Heart attack Other 12       father   Thyroid disease Other        mom    Social History    Socioeconomic History   Marital status: Single    Spouse name: Not on file   Number of children: Not on file   Years of education: Not on file   Highest education level: Not on file  Occupational History   Not on file  Tobacco Use   Smoking status: Former   Smokeless tobacco: Never  Vaping Use   Vaping Use: Never used  Substance and Sexual Activity   Alcohol use: No   Drug use: No   Sexual activity: Not on file  Other Topics Concern   Not on file  Social History Narrative   hh 1  +   Pet dog  Passed in June 15    Exercise  No etoh.   Sleep  7-8 hours.      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    Social Determinants of Health   Financial Resource Strain: Not on file  Food Insecurity: Not on file  Transportation Needs: Not on file  Physical Activity: Not on file  Stress: Not on file  Social Connections: Not on file  Outpatient Medications Prior to Visit  Medication Sig Dispense Refill   CALCIUM CITRATE-VITAMIN D PO Take by mouth. 1000 MG OF Calcium and 500 iu of vitamin D     Multiple Vitamin (MULTIVITAMIN) tablet Take 1 tablet by mouth daily.       simvastatin (ZOCOR) 40 MG tablet TAKE 1 TABLET AT BEDTIME 30 tablet 0   SYNTHROID 100 MCG tablet TAKE 1 TABLET DAILY 30 tablet 0   UNABLE TO FIND Med Name: Natural Vitality Magnesium supplement, takes 1 tsp daily     Psyllium (METAMUCIL PO) 1 TSP as needed     Turmeric POWD by Does not apply route. 1 scoop daily     No facility-administered medications prior to visit.     EXAM:  BP 116/70 (BP Location: Left Arm, Patient Position: Sitting, Cuff Size: Normal)   Pulse 72   Temp 98.6 F (37 C) (Oral)   Ht 5' (1.524 m)   Wt 162 lb 9.6 oz (73.8 kg)   LMP 05/30/2012   SpO2 95%   BMI 31.76 kg/m   Body mass index is 31.76 kg/m. Wt Readings from Last 3 Encounters:  09/15/21 162 lb 9.6 oz (73.8 kg)  03/13/20 135 lb 12.8 oz (61.6 kg)  12/05/18 171 lb 9.6 oz (77.8  kg)    Physical Exam: Vital signs reviewed EVO:JJKK is a well-developed well-nourished alert cooperative    who appearsr stated age in no acute distress.  HEENT: normocephalic atraumatic , Eyes: PERRL EOM's full, conjunctiva clear, Naresdeformity discharge ars: no deformity EAC's clear TMs with normal landmarks. Masked  NECK: supple without masses, bruits CHEST/PULM:  Clear to auscultation and percussion breath sounds equal no wheeze , rales or rhonchi. No chest wall deformities or tenderness. Breast: normal by inspection . No dimpling, discharge, masses, tenderness or discharge . CV: PMI is nondisplaced, S1 S2 no gallops, murmurs, rubs. Peripheral pulses are full without delay.No JVD .  ABDOMEN: Bowel sounds normal nontender  No guard or rebound, no hepato splenomegal no CVA tenderness. Extremtities:  No clubbing cyanosis or edema, no acute joint swelling or redness no focal atrophy NEURO:  Oriented x3, cranial nerves 3-12 appear to be intact, no obvious focal weakness,gait within normal limits no abnormal reflexes or asymmetrical SKIN: No acute rashes normal turgor, color, no bruising or petechiae. PSYCH: Oriented, good eye contact, no obvious depression anxiety, cognition and judgment appear normal. LN: no cervical axillary inguinal adenopathy  Lab Results  Component Value Date   WBC 7.1 03/13/2020   HGB 13.3 03/13/2020   HCT 40.8 03/13/2020   PLT 196.0 03/13/2020   GLUCOSE 108 (H) 03/13/2020   CHOL 296 (H) 09/17/2020   TRIG 106 09/17/2020   HDL 69 09/17/2020   LDLDIRECT 124.0 10/24/2018   LDLCALC 203 (H) 09/17/2020   ALT 25 03/13/2020   AST 24 03/13/2020   NA 139 03/13/2020   K 4.1 03/13/2020   CL 102 03/13/2020   CREATININE 0.74 03/13/2020   BUN 17 03/13/2020   CO2 31 03/13/2020   TSH 0.86 03/13/2020   HGBA1C 5.9 03/13/2020    BP Readings from Last 3 Encounters:  09/15/21 116/70  03/13/20 116/72  12/05/18 120/72    Labplan  reviewed with patient had lunch around  noon for 5 hours ago protein shake other.  ASSESSMENT AND PLAN:  Discussed the following assessment and plan:    ICD-10-CM   1. Visit for preventive health examination  X38.18 Basic metabolic panel    CBC with Differential/Platelet  Hemoglobin A1c    Hepatic function panel    Lipid panel    TSH    T4, free    T4, free    TSH    Lipid panel    Hepatic function panel    Hemoglobin A1c    CBC with Differential/Platelet    Basic metabolic panel    2. Hyperlipidemia, unspecified hyperlipidemia type  Z61.0 Basic metabolic panel    CBC with Differential/Platelet    Hemoglobin A1c    Hepatic function panel    Lipid panel    TSH    T4, free    T4, free    TSH    Lipid panel    Hepatic function panel    Hemoglobin A1c    CBC with Differential/Platelet    Basic metabolic panel    3. Hypothyroidism, unspecified type  R60.4 Basic metabolic panel    CBC with Differential/Platelet    Hemoglobin A1c    Hepatic function panel    Lipid panel    TSH    T4, free    T4, free    TSH    Lipid panel    Hepatic function panel    Hemoglobin A1c    CBC with Differential/Platelet    Basic metabolic panel    4. Need for influenza vaccination  Z23 Flu Vaccine QUAD 6+ mos PF IM (Fluarix Quad PF)    Get nonfasting lab today for convenience adherent to medication. If all okay yearly physical and lab Can get bone density follow-up after next May 2 to 3 years from last 1. Attention to lifestyle Make appointment for shingles vaccine when convenient. Will do colon cancer screening as planned.  Return in about 1 year (around 09/15/2022) for depending on results and how doing. .  Patient Care Team: Evett Kassa, Standley Brooking, MD as PCP - General Jacelyn Pi, MD (Endocrinology) Milus Banister, MD (Gastroenterology) Patient Instructions   Good to see you today .  Attention  to lifestyle intervention healthy eating and exercise .    Will notify you  of labs when available.   Make appt  for Shingrix vaccine when  convenient .  Dexa scan after may 2023  as follow up.    Standley Brooking. Mallori Araque M.D.

## 2021-09-15 NOTE — Patient Instructions (Addendum)
  Good to see you today .  Attention  to lifestyle intervention healthy eating and exercise .    Will notify you  of labs when available.   Make appt for Shingrix vaccine when  convenient .  Dexa scan after may 2023  as follow up.

## 2021-09-16 LAB — BASIC METABOLIC PANEL
BUN: 20 mg/dL (ref 6–23)
CO2: 27 mEq/L (ref 19–32)
Calcium: 10 mg/dL (ref 8.4–10.5)
Chloride: 103 mEq/L (ref 96–112)
Creatinine, Ser: 0.71 mg/dL (ref 0.40–1.20)
GFR: 91.86 mL/min (ref >60.00)
Glucose, Bld: 90 mg/dL (ref 70–99)
Potassium: 4.1 mEq/L (ref 3.5–5.1)
Sodium: 139 mEq/L (ref 135–145)

## 2021-09-16 LAB — LIPID PANEL
Cholesterol: 189 mg/dL (ref 0–200)
HDL: 49 mg/dL (ref >39.00)
LDL Cholesterol: 113 mg/dL — ABNORMAL HIGH (ref 0–99)
NonHDL: 139.93
Total CHOL/HDL Ratio: 4
Triglycerides: 133 mg/dL (ref 0.0–149.0)
VLDL: 26.6 mg/dL (ref 0.0–40.0)

## 2021-09-16 LAB — CBC WITH DIFFERENTIAL/PLATELET
Basophils Absolute: 0.1 10*3/uL (ref 0.0–0.1)
Basophils Relative: 0.7 % (ref 0.0–3.0)
Eosinophils Absolute: 0.2 10*3/uL (ref 0.0–0.7)
Eosinophils Relative: 2.8 % (ref 0.0–5.0)
HCT: 40.1 % (ref 36.0–46.0)
Hemoglobin: 13.2 g/dL (ref 12.0–15.0)
Lymphocytes Relative: 38.7 % (ref 12.0–46.0)
Lymphs Abs: 3.1 10*3/uL (ref 0.7–4.0)
MCHC: 32.9 g/dL (ref 30.0–36.0)
MCV: 91.1 fl (ref 78.0–100.0)
Monocytes Absolute: 0.5 10*3/uL (ref 0.1–1.0)
Monocytes Relative: 5.8 % (ref 3.0–12.0)
Neutro Abs: 4.2 10*3/uL (ref 1.4–7.7)
Neutrophils Relative %: 52 % (ref 43.0–77.0)
Platelets: 235 10*3/uL (ref 150.0–400.0)
RBC: 4.4 Mil/uL (ref 3.87–5.11)
RDW: 13.8 % (ref 11.5–15.5)
WBC: 8.1 10*3/uL (ref 4.0–10.5)

## 2021-09-16 LAB — T4, FREE: Free T4: 1 ng/dL (ref 0.60–1.60)

## 2021-09-16 LAB — HEPATIC FUNCTION PANEL
ALT: 22 U/L (ref 0–35)
AST: 21 U/L (ref 0–37)
Albumin: 4.6 g/dL (ref 3.5–5.2)
Alkaline Phosphatase: 71 U/L (ref 39–117)
Bilirubin, Direct: 0 mg/dL (ref 0.0–0.3)
Total Bilirubin: 0.4 mg/dL (ref 0.2–1.2)
Total Protein: 7.5 g/dL (ref 6.0–8.3)

## 2021-09-16 LAB — HEMOGLOBIN A1C: Hgb A1c MFr Bld: 6.4 % (ref 4.6–6.5)

## 2021-09-16 LAB — TSH: TSH: 3.38 u[IU]/mL (ref 0.35–5.50)

## 2021-09-19 NOTE — Progress Notes (Signed)
Thyroid normal lipids acceptable  Continue lifestyle intervention healthy eating and exercise .   And medication

## 2021-09-22 ENCOUNTER — Other Ambulatory Visit: Payer: Self-pay

## 2021-09-23 ENCOUNTER — Ambulatory Visit: Payer: BC Managed Care – PPO

## 2021-10-15 ENCOUNTER — Ambulatory Visit (INDEPENDENT_AMBULATORY_CARE_PROVIDER_SITE_OTHER): Payer: BC Managed Care – PPO | Admitting: *Deleted

## 2021-10-15 DIAGNOSIS — Z23 Encounter for immunization: Secondary | ICD-10-CM | POA: Diagnosis not present

## 2021-10-22 ENCOUNTER — Other Ambulatory Visit: Payer: Self-pay | Admitting: Internal Medicine

## 2021-10-22 MED ORDER — SIMVASTATIN 40 MG PO TABS
40.0000 mg | ORAL_TABLET | Freq: Every day | ORAL | 3 refills | Status: DC
Start: 2021-10-22 — End: 2022-01-26

## 2021-12-03 DIAGNOSIS — Z1231 Encounter for screening mammogram for malignant neoplasm of breast: Secondary | ICD-10-CM | POA: Diagnosis not present

## 2021-12-03 LAB — HM MAMMOGRAPHY

## 2021-12-12 ENCOUNTER — Encounter: Payer: Self-pay | Admitting: Internal Medicine

## 2021-12-26 ENCOUNTER — Encounter: Payer: Self-pay | Admitting: Gastroenterology

## 2021-12-31 ENCOUNTER — Ambulatory Visit (INDEPENDENT_AMBULATORY_CARE_PROVIDER_SITE_OTHER): Payer: BC Managed Care – PPO | Admitting: *Deleted

## 2021-12-31 DIAGNOSIS — Z23 Encounter for immunization: Secondary | ICD-10-CM

## 2022-01-03 ENCOUNTER — Other Ambulatory Visit: Payer: Self-pay | Admitting: Internal Medicine

## 2022-01-23 ENCOUNTER — Telehealth: Payer: Self-pay | Admitting: Internal Medicine

## 2022-01-23 NOTE — Telephone Encounter (Signed)
Pt call and stated her pharmacy don't have simvastatin (ZOCOR) 40 MG tablet  and she want it sent  [id number] - [place], [address] Phone:  [phone]  Fax:  [phone]    She stated she need a 90 day supply sent.

## 2022-01-26 MED ORDER — SIMVASTATIN 40 MG PO TABS: 40.0000 mg | ORAL_TABLET | Freq: Every day | ORAL | 2 refills | Status: DC

## 2022-01-26 NOTE — Telephone Encounter (Signed)
RX sent

## 2022-01-28 ENCOUNTER — Encounter: Payer: Self-pay | Admitting: Gastroenterology

## 2022-01-28 ENCOUNTER — Other Ambulatory Visit: Payer: Self-pay

## 2022-01-28 ENCOUNTER — Ambulatory Visit (AMBULATORY_SURGERY_CENTER): Payer: BC Managed Care – PPO

## 2022-01-28 VITALS — Ht 60.0 in | Wt 160.0 lb

## 2022-01-28 DIAGNOSIS — Z1211 Encounter for screening for malignant neoplasm of colon: Secondary | ICD-10-CM

## 2022-01-28 MED ORDER — NA SULFATE-K SULFATE-MG SULF 17.5-3.13-1.6 GM/177ML PO SOLN: 1.0000 | Freq: Once | ORAL | 0 refills | Status: AC

## 2022-01-28 NOTE — Progress Notes (Signed)
No egg or soy allergy known to patient  ?No issues known to pt with past sedation with any surgeries or procedures ?Patient denies ever being told they had issues or difficulty with intubation  ?No FH of Malignant Hyperthermia ?Pt is not on diet pills ?Pt is not on home 02  ?Pt is not on blood thinners  ?Pt denies issues with constipation;  ?No A fib or A flutter ?Pt is fully vaccinated for Covid x 2 + boosters; ?NO PA's for preps discussed with pt in PV today  ?Discussed with pt there will be an out-of-pocket cost for prep and that varies from $0 to 70 + dollars - pt verbalized understanding  ?Due to the COVID-19 pandemic we are asking patients to follow certain guidelines in PV and the [place]   ?Pt aware of COVID protocols and [facility] guidelines  ?PV completed over the phone. Pt verified name, DOB, address and insurance during PV today.  ?Pt mailed instruction packet with copy of consent form to read and not return, and instructions.  ?Pt encouraged to call with questions or issues.  ?If pt has My chart, procedure instructions sent via My Chart  ? ?

## 2022-02-11 ENCOUNTER — Encounter: Payer: Self-pay | Admitting: Gastroenterology

## 2022-02-11 ENCOUNTER — Other Ambulatory Visit: Payer: Self-pay | Admitting: Gastroenterology

## 2022-02-11 ENCOUNTER — Ambulatory Visit (AMBULATORY_SURGERY_CENTER): Payer: BC Managed Care – PPO | Admitting: Gastroenterology

## 2022-02-11 ENCOUNTER — Other Ambulatory Visit: Payer: Self-pay

## 2022-02-11 VITALS — BP 103/66 | HR 72 | Temp 96.8°F | Resp 14 | Ht 60.0 in | Wt 160.0 lb

## 2022-02-11 DIAGNOSIS — K635 Polyp of colon: Secondary | ICD-10-CM

## 2022-02-11 DIAGNOSIS — D122 Benign neoplasm of ascending colon: Secondary | ICD-10-CM

## 2022-02-11 DIAGNOSIS — Z1211 Encounter for screening for malignant neoplasm of colon: Secondary | ICD-10-CM

## 2022-02-11 HISTORY — PX: COLONOSCOPY WITH PROPOFOL: SHX5780

## 2022-02-11 MED ORDER — SODIUM CHLORIDE 0.9 % IV SOLN: 500.0000 mL | Freq: Once | INTRAVENOUS | Status: DC

## 2022-02-11 NOTE — Progress Notes (Signed)
HPI: ?This is a man at routine risk for CRC ? ? ?ROS: complete GI ROS as described in HPI, all other review negative. ? ?Constitutional:  No unintentional weight loss ? ? ?Past Medical History:  ?Diagnosis Date  ? Heart murmur   ? as a child  ? Hematuria   ? Dr Joelyn Oms 2001 Trigonitis. Ultrasound and cystoscopy  ? Hormone replacement therapy (HRT) 08/30/2013  ? Hyperlipidemia   ? on meds  ? Hypothyroid   ? Sp ablation  on meds per Dr Chalmers Cater  ? Seasonal allergies   ? ? ?Past Surgical History:  ?Procedure Laterality Date  ? COLONOSCOPY  2012  ? DJ-F/V-movi (good)-mild tics  ? COLONOSCOPY WITH PROPOFOL  02/11/2022  ? DJ - screening  ? HAND SURGERY Right 2000  ? LIPOMA EXCISION Left 2005  ? buttock 3 cm  ? ? ?Current Outpatient Medications  ?Medication Sig Dispense Refill  ? CALCIUM CITRATE-VITAMIN D PO Take 1 tablet by mouth daily at 6 (six) AM. 1000 MG OF Calcium and 500 iu of vitamin D    ? loratadine (CLARITIN) 10 MG tablet Take 10 mg by mouth daily as needed for allergies.    ? Magnesium Oxide (NATRUL MAGNESIUM PO) Take 1 tablet by mouth daily at 6 (six) AM.    ? Multiple Vitamin (MULTIVITAMIN) tablet Take 1 tablet by mouth daily.      ? simvastatin (ZOCOR) 40 MG tablet Take 1 tablet (40 mg total) by mouth at bedtime. 90 tablet 2  ? SYNTHROID 100 MCG tablet TAKE 1 TABLET DAILY 90 tablet 3  ? ?Current Facility-Administered Medications  ?Medication Dose Route Frequency Provider Last Rate Last Admin  ? 0.9 %  sodium chloride infusion  500 mL Intravenous Once Milus Banister, MD      ? ? ?Allergies as of 02/11/2022 - Review Complete 02/11/2022  ?Allergen Reaction Noted  ? Amoxicillin Hives 10/28/2017  ? ? ?Family History  ?Problem Relation Age of Onset  ? Diabetes Mother   ? Diabetes Other   ? Hyperlipidemia Other   ? Heart attack Other 62  ?     father  ? Thyroid disease Other   ?     mom  ? ? ?Social History  ? ?Socioeconomic History  ? Marital status: Single  ?  Spouse name: Not on file  ? Number of children: Not on  file  ? Years of education: Not on file  ? Highest education level: Not on file  ?Occupational History  ? Not on file  ?Tobacco Use  ? Smoking status: Former  ? Smokeless tobacco: Never  ?Vaping Use  ? Vaping Use: Never used  ?Substance and Sexual Activity  ? Alcohol use: No  ? Drug use: Yes  ?  Comment: rare  ? Sexual activity: Not Currently  ?Other Topics Concern  ? Not on file  ?Social History Narrative  ? hh 1  +  ? Pet dog  Passed in June 15   ? Exercise  No etoh.  ? Sleep  7-8 hours.  ?   ? 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   ? 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 ?Social Determinants of Health  ? ?Financial Resource Strain: Not on file  ?Food Insecurity: Not on file  ?Transportation Needs: Not on file  ?Physical Activity: Not on file  ?Stress: Not on file  ?Social Connections: Not on file  ?Intimate Partner Violence: Not on file  ? ? ? ?  Physical Exam: ?BP 132/84   Pulse 68   Temp (!) 96.8 ?F (36 ?C) (Temporal)   Ht 5' (1.524 m)   Wt 160 lb (72.6 kg)   LMP 05/30/2012   SpO2 100%   BMI 31.25 kg/m?  ?Constitutional: generally well-appearing ?Psychiatric: alert and oriented x3 ?Lungs: CTA bilaterally ?Heart: no MCR ? ?Assessment and plan: ?61 y.o. female with routine risk for CRC ? ?Screening colonoscoyp today ? ?Care is appropriate for the ambulatory setting. ? ?Owens Loffler, MD ?[facility] ?02/11/2022, 7:50 AM ? ? ? ?

## 2022-02-11 NOTE — Patient Instructions (Signed)
Handout on polyps and diverticulosis provided  ? ?Await pathology results.  ? ?Continue current medications.  ?YOU HAD AN ENDOSCOPIC PROCEDURE TODAY AT [facility]:   Refer to the procedure report that was given to you for any specific questions about what was found during the examination.  If the procedure report does not answer your questions, please call your gastroenterologist to clarify.  If you requested that your care partner not be given the details of your procedure findings, then the procedure report has been included in a sealed envelope for you to review at your convenience later. ? ?YOU SHOULD EXPECT: Some feelings of bloating in the abdomen. Passage of more gas than usual.  Walking can help get rid of the air that was put into your GI tract during the procedure and reduce the bloating. If you had a lower endoscopy (such as a colonoscopy or flexible sigmoidoscopy) you may notice spotting of blood in your stool or on the toilet paper. If you underwent a bowel prep for your procedure, you may not have a normal bowel movement for a few days. ? ?Please Note:  You might notice some irritation and congestion in your nose or some drainage.  This is from the oxygen used during your procedure.  There is no need for concern and it should clear up in a day or so. ? ?SYMPTOMS TO REPORT IMMEDIATELY: ? ?Following lower endoscopy (colonoscopy or flexible sigmoidoscopy): ? Excessive amounts of blood in the stool ? Significant tenderness or worsening of abdominal pains ? Swelling of the abdomen that is new, acute ? Fever of 100?F or higher ? ?For urgent or emergent issues, a gastroenterologist can be reached at any hour by calling [phone]. ?Do not use MyChart messaging for urgent concerns.  ? ? ?DIET:  We do recommend a small meal at first, but then you may proceed to your regular diet.  Drink plenty of fluids but you should avoid alcoholic beverages for 24 hours. ? ?ACTIVITY:  You should plan to  take it easy for the rest of today and you should NOT DRIVE or use heavy machinery until tomorrow (because of the sedation medicines used during the test).   ? ?FOLLOW UP: ?Our staff will call the number listed on your records 48-72 hours following your procedure to check on you and address any questions or concerns that you may have regarding the information given to you following your procedure. If we do not reach you, we will leave a message.  We will attempt to reach you two times.  During this call, we will ask if you have developed any symptoms of COVID 19. If you develop any symptoms (ie: fever, flu-like symptoms, shortness of breath, cough etc.) before then, please call [phone].  If you test positive for Covid 19 in the 2 weeks post procedure, please call and report this information to [place].   ? ?If any biopsies were taken you will be contacted by phone or by letter within the next 1-3 weeks.  Please call us at [phone] if you have not heard about the biopsies in 3 weeks.  ? ? ?SIGNATURES/CONFIDENTIALITY: ?You and/or your care partner have signed paperwork which will be entered into your electronic medical record.  These signatures attest to the fact that that the information above on your After Visit Summary has been reviewed and is understood.  Full responsibility of the confidentiality of this discharge information lies with you and/or your care-partner. ? ? ?

## 2022-02-11 NOTE — Progress Notes (Signed)
PT taken to PACU. Monitors in place. VSS. Report given to RN. 

## 2022-02-11 NOTE — Op Note (Signed)
[facility] ?Patient Name: Sarah Floyd ?Procedure Date: 02/11/2022 8:25 AM ?MRN: [id number] ?Endoscopist: Milus Banister , MD ?Age: 61 ?Referring MD:  ?Date of Birth: 1961/04/19 ?Gender: Female ?Account #: [id number] ?Procedure:                Colonoscopy ?Indications:              Screening for colorectal malignant neoplasm ?Medicines:                Monitored Anesthesia Care ?Procedure:                Pre-Anesthesia Assessment: ?                          - Prior to the procedure, a History and Physical  ?                          was performed, and patient medications and  ?                          allergies were reviewed. The patient's tolerance of  ?                          previous anesthesia was also reviewed. The risks  ?                          and benefits of the procedure and the sedation  ?                          options and risks were discussed with the patient.  ?                          All questions were answered, and informed consent  ?                          was obtained. Prior Anticoagulants: The patient has  ?                          taken no previous anticoagulant or antiplatelet  ?                          agents. ASA Grade Assessment: II - A patient with  ?                          mild systemic disease. After reviewing the risks  ?                          and benefits, the patient was deemed in  ?                          satisfactory condition to undergo the procedure. ?                          After obtaining informed consent, the colonoscope  ?  was passed under direct vision. Throughout the  ?                          procedure, the patient's blood pressure, pulse, and  ?                          oxygen saturations were monitored continuously. The  ?                          CF [id number] #[id number] was introduced through the anus  ?                          and advanced to the the cecum, identified by  ?                          appendiceal  orifice and ileocecal valve. The  ?                          colonoscopy was performed without difficulty. The  ?                          patient tolerated the procedure well. The quality  ?                          of the bowel preparation was good. The ileocecal  ?                          valve, appendiceal orifice, and rectum were  ?                          photographed. ?Scope In: 8:34:03 AM ?Scope Out: 8:46:53 AM ?Scope Withdrawal Time: 0 hours 9 minutes 30 seconds  ?Total Procedure Duration: 0 hours 12 minutes 50 seconds  ?Findings:                 A 3 mm polyp was found in the ascending colon. The  ?                          polyp was sessile. The polyp was removed with a  ?                          cold snare. Resection and retrieval were complete. ?                          Multiple small and large-mouthed diverticula were  ?                          found in the entire colon. ?                          The exam was otherwise without abnormality on  ?                          direct and retroflexion views. ?Complications:  No immediate complications. Estimated blood loss:  ?                          None. ?Estimated Blood Loss:     Estimated blood loss: none. ?Impression:               - One 3 mm polyp in the ascending colon, removed  ?                          with a cold snare. Resected and retrieved. ?                          - Diverticulosis in the entire examined colon. ?                          - The examination was otherwise normal on direct  ?                          and retroflexion views. ?Recommendation:           - Patient has a contact number available for  ?                          emergencies. The signs and symptoms of potential  ?                          delayed complications were discussed with the  ?                          patient. Return to normal activities tomorrow.  ?                          Written discharge instructions were provided to the  ?                           patient. ?                          - Resume previous diet. ?                          - Continue present medications. ?                          - Await pathology results. ?Milus Banister, MD ?02/11/2022 8:48:48 AM ?This report has been signed electronically. ?

## 2022-02-11 NOTE — Progress Notes (Signed)
Pt's states no medical or surgical changes since previsit or office visit.  VS CW  

## 2022-02-13 ENCOUNTER — Telehealth: Payer: Self-pay | Admitting: *Deleted

## 2022-02-13 NOTE — Telephone Encounter (Signed)
?  Follow up Call- ? ?Call back number 02/11/2022  ?Post procedure Call Back phone  # [phone]  ?Permission to leave phone message Yes  ?Some recent data might be hidden  ?  ? ?Patient questions: ? ?Do you have a fever, pain , or abdominal swelling? No. ?Pain Score  0 * ? ?Have you tolerated food without any problems? Yes.   ? ?Have you been able to return to your normal activities? Yes.   ? ?Do you have any questions about your discharge instructions: ?Diet   No. ?Medications  No. ?Follow up visit  No. ? ?Do you have questions or concerns about your Care? No. ? ?Actions: ?* If pain score is 4 or above: ?No action needed, pain <4. ? ? ?

## 2022-02-17 ENCOUNTER — Encounter: Payer: Self-pay | Admitting: Gastroenterology

## 2022-07-14 DIAGNOSIS — L820 Inflamed seborrheic keratosis: Secondary | ICD-10-CM | POA: Diagnosis not present

## 2022-07-14 DIAGNOSIS — L821 Other seborrheic keratosis: Secondary | ICD-10-CM | POA: Diagnosis not present

## 2022-07-14 DIAGNOSIS — D2371 Other benign neoplasm of skin of right lower limb, including hip: Secondary | ICD-10-CM | POA: Diagnosis not present

## 2022-07-14 DIAGNOSIS — L718 Other rosacea: Secondary | ICD-10-CM | POA: Diagnosis not present

## 2022-07-14 DIAGNOSIS — D1801 Hemangioma of skin and subcutaneous tissue: Secondary | ICD-10-CM | POA: Diagnosis not present

## 2022-08-18 ENCOUNTER — Encounter: Payer: Self-pay | Admitting: Family Medicine

## 2022-08-18 ENCOUNTER — Ambulatory Visit: Payer: BC Managed Care – PPO | Admitting: Family Medicine

## 2022-08-18 VITALS — BP 110/80 | HR 75 | Temp 98.2°F | Ht 60.0 in | Wt 167.2 lb

## 2022-08-18 DIAGNOSIS — H66011 Acute suppurative otitis media with spontaneous rupture of ear drum, right ear: Secondary | ICD-10-CM

## 2022-08-18 NOTE — Progress Notes (Signed)
Established Patient Office Visit  Subjective   Patient ID: Sarah Floyd, female    DOB: [birth year]  Age: 61 y.o. MRN: [id number]  Chief Complaint  Patient presents with   Ear Drainage    Patient complains of right ear drainage, x4 days     HPI   Seen with right ear pressure and some drainage of the right ear about 4 days ago.  She recalls last Tuesday she had mild sore throat.  She has some postnasal drainage.  She had COVID testing Thursday which was negative.  Last week and on Friday and Saturday she felt she had some low-grade fever.  She then noticed on Saturday some yellowish mucoid drainage from the right ear.  No recent change of altitude.  She did virtual urgent care visit and was placed on clindamycin 300 mg 3 times daily because of her penicillin allergy.  Tolerating well with no side effects thus far.  Ear pain slightly better but does have muffled hearing and still slight sensation of pressure.  No fever.  Past Medical History:  Diagnosis Date   Heart murmur    as a child   Hematuria    Dr Joelyn Oms 2001 Trigonitis. Ultrasound and cystoscopy   Hormone replacement therapy (HRT) 08/30/2013   Hyperlipidemia    on meds   Hypothyroid    Sp ablation  on meds per Dr Chalmers Cater   Seasonal allergies    Past Surgical History:  Procedure Laterality Date   COLONOSCOPY  2012   DJ-F/V-movi (good)-mild tics   COLONOSCOPY WITH PROPOFOL  02/11/2022   DJ - screening   HAND SURGERY Right 2000   LIPOMA EXCISION Left 2005   buttock 3 cm    reports that she has quit smoking. She has never used smokeless tobacco. She reports current drug use. She reports that she does not drink alcohol. family history includes Diabetes in her mother and another family member; Heart attack (age of onset: 72) in an other family member; Hyperlipidemia in an other family member; Thyroid disease in an other family member. Allergies  Allergen Reactions   Amoxicillin Hives    Review of Systems   Constitutional:  Negative for chills and fever.  HENT:  Positive for ear discharge and ear pain.   Respiratory:  Negative for cough.       Objective:     BP 110/80 (BP Location: Left Arm, Patient Position: Sitting, Cuff Size: Normal)   Pulse 75   Temp 98.2 F (36.8 C) (Oral)   Ht 5' (1.524 m)   Wt 167 lb 3.2 oz (75.8 kg)   LMP 05/30/2012   SpO2 98%   BMI 32.65 kg/m    Physical Exam Vitals reviewed.  Constitutional:      Appearance: Normal appearance.  HENT:     Ears:     Comments: Left TM is normal.  Right TM reveals couple areas of crusted blood on the superior portion of the eardrum but no obvious perforation.  She has yellowish discoloration layering out along the lower portion of the eardrum consistent with suppurative otitis.  Mild diffuse erythema Neurological:     Mental Status: She is alert.      No results found for any visits on 08/18/22.    The 10-year ASCVD risk score (Arnett DK, et al., 2019) is: 6%    Assessment & Plan:   Right acute suppurative otitis media.  Probably had very small spontaneous perforation which is already healing over as evidenced from  dried blood on the eardrum.  No obvious hole.  Does have suppurative changes and some visible fluid.  Finish out antibiotics.  Follow-up with primary if symptoms not improving over the next couple weeks  No follow-ups on file.    Carolann Littler, MD

## 2022-09-03 DIAGNOSIS — H43392 Other vitreous opacities, left eye: Secondary | ICD-10-CM | POA: Diagnosis not present

## 2022-09-08 ENCOUNTER — Encounter: Payer: Self-pay | Admitting: Family Medicine

## 2022-09-15 NOTE — Progress Notes (Unsigned)
No chief complaint on file.   HPI: Sarah Floyd 61 y.o. come in for fu ear   Ed 9 16  919 dr B 10 10 ear referral requewsed still with sx  ROS: See pertinent positives and negatives per HPI.  Past Medical History:  Diagnosis Date   Heart murmur    as a child   Hematuria    Dr Joelyn Oms 2001 Trigonitis. Ultrasound and cystoscopy   Hormone replacement therapy (HRT) 08/30/2013   Hyperlipidemia    on meds   Hypothyroid    Sp ablation  on meds per Dr Chalmers Cater   Seasonal allergies     Family History  Problem Relation Age of Onset   Diabetes Mother    Diabetes Other    Hyperlipidemia Other    Heart attack Other 62       father   Thyroid disease Other        mom    Social History   Socioeconomic History   Marital status: Single    Spouse name: Not on file   Number of children: Not on file   Years of education: Not on file   Highest education level: Bachelor's degree (e.g., BA, AB, BS)  Occupational History   Not on file  Tobacco Use   Smoking status: Former   Smokeless tobacco: Never  Vaping Use   Vaping Use: Never used  Substance and Sexual Activity   Alcohol use: No   Drug use: Yes    Comment: rare   Sexual activity: Not Currently  Other Topics Concern   Not on file  Social History Narrative   hh 1  +   Pet dog  Passed in June 15    Exercise  No etoh.   Sleep  7-8 hours.      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    Social Determinants of Health   Financial Resource Strain: Not on file  Food Insecurity: No Food Insecurity (09/14/2022)   Hunger Vital Sign    Worried About Running Out of Food in the Last Year: Never true    Ran Out of Food in the Last Year: Never true  Transportation Needs: No Transportation Needs (09/14/2022)   PRAPARE - Hydrologist (Medical): No    Lack of Transportation (Non-Medical): No  Physical Activity: Insufficiently Active (09/14/2022)    Exercise Vital Sign    Days of Exercise per Week: 3 days    Minutes of Exercise per Session: 30 min  Stress: No Stress Concern Present (09/14/2022)   [place]    Feeling of Stress : Only a little  Social Connections: Moderately Integrated (09/14/2022)   Social Connection and Isolation Panel [NHANES]    Frequency of Communication with Friends and Family: More than three times a week    Frequency of Social Gatherings with Friends and Family: More than three times a week    Attends Religious Services: More than 4 times per year    Active Member of [employer] or Organizations: Yes    Attends Music therapist: More than 4 times per year    Marital Status: Never married    Outpatient Medications Prior to Visit  Medication Sig Dispense Refill   acetaminophen (TYLENOL) 325 MG tablet Take 650 mg by mouth every 6 (six) hours as needed.     CALCIUM  CITRATE-VITAMIN D PO Take 1 tablet by mouth daily at 6 (six) AM. 1000 MG OF Calcium and 500 iu of vitamin D     clindamycin (CLEOCIN) 300 MG capsule Take 300 mg by mouth 3 (three) times daily.     ibuprofen (ADVIL) 200 MG tablet Take 200 mg by mouth every 6 (six) hours as needed.     loratadine (CLARITIN) 10 MG tablet Take 10 mg by mouth daily as needed for allergies.     Magnesium Oxide (NATRUL MAGNESIUM PO) Take 1 tablet by mouth daily at 6 (six) AM.     Multiple Vitamin (MULTIVITAMIN) tablet Take 1 tablet by mouth daily.       simvastatin (ZOCOR) 40 MG tablet Take 1 tablet (40 mg total) by mouth at bedtime. 90 tablet 2   SYNTHROID 100 MCG tablet TAKE 1 TABLET DAILY 90 tablet 3   No facility-administered medications prior to visit.     EXAM:  LMP 05/30/2012   There is no height or weight on file to calculate BMI.  GENERAL: vitals reviewed and listed above, alert, oriented, appears well hydrated and in no acute distress HEENT: atraumatic, conjunctiva  clear, no obvious  abnormalities on inspection of external nose and ears OP : no lesion edema or exudate  NECK: no obvious masses on inspection palpation  LUNGS: clear to auscultation bilaterally, no wheezes, rales or rhonchi, good air movement CV: HRRR, no clubbing cyanosis or  peripheral edema nl cap refill  MS: moves all extremities without noticeable focal  abnormality PSYCH: pleasant and cooperative, no obvious depression or anxiety Lab Results  Component Value Date   WBC 8.1 09/15/2021   HGB 13.2 09/15/2021   HCT 40.1 09/15/2021   PLT 235.0 09/15/2021   GLUCOSE 90 09/15/2021   CHOL 189 09/15/2021   TRIG 133.0 09/15/2021   HDL 49.00 09/15/2021   LDLDIRECT 124.0 10/24/2018   LDLCALC 113 (H) 09/15/2021   ALT 22 09/15/2021   AST 21 09/15/2021   NA 139 09/15/2021   K 4.1 09/15/2021   CL 103 09/15/2021   CREATININE 0.71 09/15/2021   BUN 20 09/15/2021   CO2 27 09/15/2021   TSH 3.38 09/15/2021   HGBA1C 6.4 09/15/2021   BP Readings from Last 3 Encounters:  08/18/22 110/80  02/11/22 103/66  09/15/21 116/70    ASSESSMENT AND PLAN:  Discussed the following assessment and plan:  No diagnosis found.  -Patient advised to return or notify health care team  if  new concerns arise.  There are no Patient Instructions on file for this visit.   Standley Brooking. Foday Cone M.D.

## 2022-09-16 ENCOUNTER — Encounter: Payer: Self-pay | Admitting: Internal Medicine

## 2022-09-16 ENCOUNTER — Ambulatory Visit: Payer: BC Managed Care – PPO | Admitting: Internal Medicine

## 2022-09-16 VITALS — BP 108/60 | HR 85 | Temp 98.1°F | Wt 164.8 lb

## 2022-09-16 DIAGNOSIS — Z23 Encounter for immunization: Secondary | ICD-10-CM | POA: Diagnosis not present

## 2022-09-16 DIAGNOSIS — H6991 Unspecified Eustachian tube disorder, right ear: Secondary | ICD-10-CM

## 2022-09-16 DIAGNOSIS — H6591 Unspecified nonsuppurative otitis media, right ear: Secondary | ICD-10-CM

## 2022-09-16 DIAGNOSIS — H9191 Unspecified hearing loss, right ear: Secondary | ICD-10-CM | POA: Diagnosis not present

## 2022-09-16 NOTE — Patient Instructions (Signed)
Ear looks like small amount of  fluid left  and should resolve in near future  b\\ Before getting on plane try  afrin thpe nose spray on right incase  hard to equilibrate   Eustachian tube  dysfunction    we can refer to ent   if on going or worse .

## 2022-09-23 ENCOUNTER — Other Ambulatory Visit: Payer: Self-pay | Admitting: Internal Medicine

## 2022-10-27 ENCOUNTER — Ambulatory Visit (INDEPENDENT_AMBULATORY_CARE_PROVIDER_SITE_OTHER): Payer: BC Managed Care – PPO | Admitting: Internal Medicine

## 2022-10-27 ENCOUNTER — Encounter: Payer: Self-pay | Admitting: Internal Medicine

## 2022-10-27 VITALS — BP 130/80 | HR 59 | Temp 98.1°F | Ht 60.0 in | Wt 166.8 lb

## 2022-10-27 DIAGNOSIS — Z0000 Encounter for general adult medical examination without abnormal findings: Secondary | ICD-10-CM | POA: Diagnosis not present

## 2022-10-27 DIAGNOSIS — E039 Hypothyroidism, unspecified: Secondary | ICD-10-CM

## 2022-10-27 DIAGNOSIS — E785 Hyperlipidemia, unspecified: Secondary | ICD-10-CM

## 2022-10-27 DIAGNOSIS — Z79899 Other long term (current) drug therapy: Secondary | ICD-10-CM

## 2022-10-27 LAB — CBC WITH DIFFERENTIAL/PLATELET
Basophils Absolute: 0.1 10*3/uL (ref 0.0–0.1)
Basophils Relative: 0.9 % (ref 0.0–3.0)
Eosinophils Absolute: 0.2 10*3/uL (ref 0.0–0.7)
Eosinophils Relative: 2.8 % (ref 0.0–5.0)
HCT: 41.2 % (ref 36.0–46.0)
Hemoglobin: 13.5 g/dL (ref 12.0–15.0)
Lymphocytes Relative: 38 % (ref 12.0–46.0)
Lymphs Abs: 3 10*3/uL (ref 0.7–4.0)
MCHC: 32.8 g/dL (ref 30.0–36.0)
MCV: 91.6 fl (ref 78.0–100.0)
Monocytes Absolute: 0.6 10*3/uL (ref 0.1–1.0)
Monocytes Relative: 7.2 % (ref 3.0–12.0)
Neutro Abs: 4.1 10*3/uL (ref 1.4–7.7)
Neutrophils Relative %: 51.1 % (ref 43.0–77.0)
Platelets: 248 10*3/uL (ref 150.0–400.0)
RBC: 4.49 Mil/uL (ref 3.87–5.11)
RDW: 14.7 % (ref 11.5–15.5)
WBC: 7.9 10*3/uL (ref 4.0–10.5)

## 2022-10-27 LAB — BASIC METABOLIC PANEL
BUN: 14 mg/dL (ref 6–23)
CO2: 29 mEq/L (ref 19–32)
Calcium: 9.9 mg/dL (ref 8.4–10.5)
Chloride: 105 mEq/L (ref 96–112)
Creatinine, Ser: 0.64 mg/dL (ref 0.40–1.20)
GFR: 94.73 mL/min (ref >60.00)
Glucose, Bld: 99 mg/dL (ref 70–99)
Potassium: 4 mEq/L (ref 3.5–5.1)
Sodium: 140 mEq/L (ref 135–145)

## 2022-10-27 LAB — HEPATIC FUNCTION PANEL
ALT: 24 U/L (ref 0–35)
AST: 21 U/L (ref 0–37)
Albumin: 4.6 g/dL (ref 3.5–5.2)
Alkaline Phosphatase: 71 U/L (ref 39–117)
Bilirubin, Direct: 0.1 mg/dL (ref 0.0–0.3)
Total Bilirubin: 0.4 mg/dL (ref 0.2–1.2)
Total Protein: 7.5 g/dL (ref 6.0–8.3)

## 2022-10-27 LAB — TSH: TSH: 0.14 u[IU]/mL — ABNORMAL LOW (ref 0.35–5.50)

## 2022-10-27 LAB — LIPID PANEL
Cholesterol: 175 mg/dL (ref 0–200)
HDL: 51 mg/dL (ref >39.00)
LDL Cholesterol: 90 mg/dL (ref 0–99)
NonHDL: 123.83
Total CHOL/HDL Ratio: 3
Triglycerides: 167 mg/dL — ABNORMAL HIGH (ref 0.0–149.0)
VLDL: 33.4 mg/dL (ref 0.0–40.0)

## 2022-10-27 LAB — HEMOGLOBIN A1C: Hgb A1c MFr Bld: 6.6 % — ABNORMAL HIGH (ref 4.6–6.5)

## 2022-10-27 MED ORDER — SIMVASTATIN 40 MG PO TABS: 40.0000 mg | ORAL_TABLET | Freq: Every day | ORAL | 3 refills | Status: DC

## 2022-10-27 NOTE — Patient Instructions (Signed)
Good seeing you today . Work on moving more  Lab today  Will refill [employer]

## 2022-10-27 NOTE — Progress Notes (Signed)
Chief Complaint  Patient presents with   Annual Exam    HPI: Patient  Sarah Floyd  61 y.o. comes in today for Preventive Health Care visit  And med management HLD  simvastatin needs refill  Thyroid: synthroid  good adherance  [facility]  dexa per dr Chalmers Cater in past  Ear much better   crackles at time right but floght was ok  and feels hearing back to normal Health Maintenance  Topic Date Due   FOOT EXAM  Never done   HIV Screening  Never done   Diabetic kidney evaluation - Urine ACR  Never done   Hepatitis C Screening  Never done   HEMOGLOBIN A1C  03/16/2022   OPHTHALMOLOGY EXAM  07/15/2022   COVID-19 Vaccine (5 - 2023-24 season) 07/31/2022   Diabetic kidney evaluation - GFR measurement  09/15/2022   PAP SMEAR-Modifier  03/14/2023   MAMMOGRAM  12/04/2023   COLONOSCOPY (Pts 45-14yr Insurance coverage will need to be confirmed)  02/12/2032   INFLUENZA VACCINE  Completed   Zoster Vaccines- Shingrix  Completed   HPV VACCINES  Aged Out   Health Maintenance Review LIFESTYLE:  Exercise:   "so Bad" not reg but will monitor   Tobacco/ETS: no Alcohol:  no Sugar beverages: no Sleep: at lest 7-8 some interrupted nocturia  Drug use: no HH of  1 no pets  Work:  remote from home  9 - 6-7  5 day aper week.    ROS:   has floater left eye  GEN/ HEENT: No fever, significant weight changes sweats headaches vision problems hearing changes, CV/ PULM; No chest pain shortness of breath cough, syncope,edema  change in exercise tolerance. GI /GU: No adominal pain, vomiting, change in bowel habits. No blood in the stool. No significant GU symptoms. SKIN/HEME: ,no acute skin rashes suspicious lesions or bleeding. No lymphadenopathy, nodules, masses.  NEURO/ PSYCH:  No neurologic signs such as weakness numbness. No depression anxiety. IMM/ Allergy: No unusual infections.  Allergy .   REST of 12 system review negative except as per HPI   Past Medical History:  Diagnosis Date   Heart  murmur    as a child   Hematuria    Dr SJoelyn Oms2001 Trigonitis. Ultrasound and cystoscopy   Hormone replacement therapy (HRT) 08/30/2013   Hyperlipidemia    on meds   Hypothyroid    Sp ablation  on meds per Dr BChalmers Cater  Seasonal allergies     Past Surgical History:  Procedure Laterality Date   COLONOSCOPY  2012   DJ-F/V-movi (good)-mild tics   COLONOSCOPY WITH PROPOFOL  02/11/2022   DJ - screening   HAND SURGERY Right 2000   LIPOMA EXCISION Left 2005   buttock 3 cm    Family History  Problem Relation Age of Onset   Diabetes Mother    Diabetes Other    Hyperlipidemia Other    Heart attack Other 62      father   Thyroid disease Other        mom    Social History   Socioeconomic History   Marital status: Single    Spouse name: Not on file   Number of children: Not on file   Years of education: Not on file   Highest education level: Bachelor's degree (e.g., BA, AB, BS)  Occupational History   Not on file  Tobacco Use   Smoking status: Former   Smokeless tobacco: Never  Vaping Use   Vaping Use: Never  used  Substance and Sexual Activity   Alcohol use: No   Drug use: Yes    Comment: rare   Sexual activity: Not Currently  Other Topics Concern   Not on file  Social History Narrative   hh 1  +   Pet dog  Passed in June 15    Exercise  No etoh.   Sleep  7-8 hours.      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    Social Determinants of Health   Financial Resource Strain: Not on file  Food Insecurity: No Food Insecurity (09/14/2022)   Hunger Vital Sign    Worried About Running Out of Food in the Last Year: Never true    Ran Out of Food in the Last Year: Never true  Transportation Needs: No Transportation Needs (09/14/2022)   PRAPARE - Hydrologist (Medical): No    Lack of Transportation (Non-Medical): No  Physical Activity: Insufficiently Active (09/14/2022)   Exercise Vital Sign     Days of Exercise per Week: 3 days    Minutes of Exercise per Session: 30 min  Stress: No Stress Concern Present (09/14/2022)   [place]    Feeling of Stress : Only a little  Social Connections: Moderately Integrated (09/14/2022)   Social Connection and Isolation Panel [NHANES]    Frequency of Communication with Friends and Family: More than three times a week    Frequency of Social Gatherings with Friends and Family: More than three times a week    Attends Religious Services: More than 4 times per year    Active Member of [employer] or Organizations: Yes    Attends Music therapist: More than 4 times per year    Marital Status: Never married    Outpatient Medications Prior to Visit  Medication Sig Dispense Refill   acetaminophen (TYLENOL) 325 MG tablet Take 650 mg by mouth every 6 (six) hours as needed.     BALANCED B COMPLEX CR PO      CALCIUM CITRATE-VITAMIN D PO Take 1 tablet by mouth daily at 6 (six) AM. 1000 MG OF Calcium and 500 iu of vitamin D     ibuprofen (ADVIL) 200 MG tablet Take 200 mg by mouth every 6 (six) hours as needed.     loratadine (CLARITIN) 10 MG tablet Take 10 mg by mouth daily as needed for allergies.     Magnesium Oxide (NATRUL MAGNESIUM PO) Take 1 tablet by mouth daily at 6 (six) AM.     metroNIDAZOLE (METROGEL) 0.75 % gel Apply 1 Application topically 2 (two) times daily.     Multiple Vitamin (MULTIVITAMIN) tablet Take 1 tablet by mouth daily.       SYNTHROID 100 MCG tablet TAKE 1 TABLET DAILY 90 tablet 0   simvastatin (ZOCOR) 40 MG tablet Take 1 tablet (40 mg total) by mouth at bedtime. 90 tablet 2   No facility-administered medications prior to visit.     EXAM:  BP 130/80 (BP Location: Right Arm, Patient Position: Sitting, Cuff Size: Large)   Pulse (!) 59   Temp 98.1 F (36.7 C) (Oral)   Ht 5' (1.524 m)   Wt 166 lb 12.8 oz (75.7 kg)   LMP 05/30/2012   SpO2 93%   BMI 32.58  kg/m   Body mass index is 32.58 kg/m. Wt Readings from Last  3 Encounters:  10/27/22 166 lb 12.8 oz (75.7 kg)  09/16/22 164 lb 12.8 oz (74.8 kg)  08/18/22 167 lb 3.2 oz (75.8 kg)    Physical Exam: Vital signs reviewed PJS:RPRX is a well-developed well-nourished alert cooperative    who appearsr stated age in no acute distress.  HEENT: normocephalic atraumatic , Eyes: PERRL EOM's full, conjunctiva clear, Nares: paten,t no deformity discharge or tenderness., Ears: no deformity EAC's clear TMs with normal landmarks. Right eac slight amount of wax  inferior  Mouth: clear OP, no lesions, edema.  Moist mucous membranes. Dentition in adequate repair. NECK: supple without masses, thyromegaly or bruits. CHEST/PULM:  Clear to auscultation and percussion breath sounds equal no wheeze , rales or rhonchi. No chest wall deformities or tenderness. Breast: normal by inspection . No dimpling, discharge, masses, tenderness or discharge . CV: PMI is nondisplaced, S1 S2 no gallops, murmurs, rubs. No click noted  Peripheral pulses are full without delay.No JVD .  ABDOMEN: Bowel sounds normal nontender  No guard or rebound, no hepato splenomegal no CVA tenderness.  Extremtities:  No clubbing cyanosis or edema, no acute joint swelling or redness no focal atrophy NEURO:  Oriented x3, cranial nerves 3-12 appear to be intact, no obvious focal weakness,gait within normal limits no abnormal reflexes or asymmetrical SKIN: No acute rashes normal turgor, color, no bruising or petechiae. PSYCH: Oriented, good eye contact, no obvious depression anxiety, cognition and judgment appear normal. LN: no cervical axillary inguinal adenopathy  Lab Results  Component Value Date   WBC 8.1 09/15/2021   HGB 13.2 09/15/2021   HCT 40.1 09/15/2021   PLT 235.0 09/15/2021   GLUCOSE 90 09/15/2021   CHOL 189 09/15/2021   TRIG 133.0 09/15/2021   HDL 49.00 09/15/2021   LDLDIRECT 124.0 10/24/2018   LDLCALC 113 (H) 09/15/2021   ALT 22  09/15/2021   AST 21 09/15/2021   NA 139 09/15/2021   K 4.1 09/15/2021   CL 103 09/15/2021   CREATININE 0.71 09/15/2021   BUN 20 09/15/2021   CO2 27 09/15/2021   TSH 3.38 09/15/2021   HGBA1C 6.4 09/15/2021    BP Readings from Last 3 Encounters:  10/27/22 130/80  09/16/22 108/60  08/18/22 110/80    Lab plan fasting  reviewed with patient   ASSESSMENT AND PLAN:  Discussed the following assessment and plan:    ICD-10-CM   1. Visit for preventive health examination  Y58.59 Basic metabolic panel    CBC with Differential/Platelet    Hemoglobin A1c    Hepatic function panel    Lipid panel    TSH    TSH    Lipid panel    Hepatic function panel    Hemoglobin A1c    CBC with Differential/Platelet    Basic metabolic panel    2. Hypothyroidism, unspecified type  Y92.4 Basic metabolic panel    CBC with Differential/Platelet    Hemoglobin A1c    Hepatic function panel    Lipid panel    TSH    TSH    Lipid panel    Hepatic function panel    Hemoglobin A1c    CBC with Differential/Platelet    Basic metabolic panel    3. Hyperlipidemia, unspecified hyperlipidemia type  M62.8 Basic metabolic panel    CBC with Differential/Platelet    Hemoglobin A1c    Hepatic function panel    Lipid panel    TSH    TSH    Lipid panel    Hepatic  function panel    Hemoglobin A1c    CBC with Differential/Platelet    Basic metabolic panel    4. Medication management  Z61.096 Basic metabolic panel    CBC with Differential/Platelet    Hemoglobin A1c    Hepatic function panel    Lipid panel    TSH    TSH    Lipid panel    Hepatic function panel    Hemoglobin A1c    CBC with Differential/Platelet    Basic metabolic panel    Faimily hx dm   Add activity to  lsi  Continue meds med monitoring  Epic is flagging as dm but she is not diabetic  may have  glucose intolerance .  Continue meds .  Return in about 1 year (around 10/28/2023) for depending on results, preventive /cpx and  medications.  Patient Care Team: Burnis Medin, MD as PCP - General Milus Banister, MD (Gastroenterology) Patient Instructions  Good seeing you today . Work on moving more  Lab today  Will refill [employer]. Saysha Menta M.D.

## 2022-10-28 NOTE — Progress Notes (Signed)
So blood sugar is up for you  borderline diabetic range   Thyroid is over suppressed  ( not su er why )   I already sent in the refill   So  only take the synthroid  6 days per week and  update the med list  which day she is not taking .  Then  repeat tsh  and hg A1c in 3 months   Then ROV virtual ok  to followup the thyroid and blood sugar  levels  and discuss

## 2022-10-29 ENCOUNTER — Other Ambulatory Visit: Payer: Self-pay

## 2022-10-29 DIAGNOSIS — R739 Hyperglycemia, unspecified: Secondary | ICD-10-CM

## 2022-10-29 DIAGNOSIS — E039 Hypothyroidism, unspecified: Secondary | ICD-10-CM

## 2022-10-29 MED ORDER — LEVOTHYROXINE SODIUM 100 MCG PO TABS: ORAL_TABLET | ORAL | 0 refills | Status: DC

## 2022-11-04 ENCOUNTER — Other Ambulatory Visit: Payer: Self-pay | Admitting: Internal Medicine

## 2022-12-09 DIAGNOSIS — Z1231 Encounter for screening mammogram for malignant neoplasm of breast: Secondary | ICD-10-CM | POA: Diagnosis not present

## 2022-12-09 LAB — HM MAMMOGRAPHY

## 2022-12-14 ENCOUNTER — Encounter: Payer: Self-pay | Admitting: Internal Medicine

## 2022-12-15 ENCOUNTER — Other Ambulatory Visit: Payer: Self-pay | Admitting: Internal Medicine

## 2022-12-15 DIAGNOSIS — E039 Hypothyroidism, unspecified: Secondary | ICD-10-CM

## 2023-02-03 ENCOUNTER — Encounter: Payer: Self-pay | Admitting: Family Medicine

## 2023-02-03 ENCOUNTER — Telehealth (INDEPENDENT_AMBULATORY_CARE_PROVIDER_SITE_OTHER): Payer: BC Managed Care – PPO | Admitting: Family Medicine

## 2023-02-03 VITALS — Wt 159.0 lb

## 2023-02-03 DIAGNOSIS — U071 COVID-19: Secondary | ICD-10-CM | POA: Diagnosis not present

## 2023-02-03 DIAGNOSIS — J349 Unspecified disorder of nose and nasal sinuses: Secondary | ICD-10-CM

## 2023-02-03 DIAGNOSIS — R519 Headache, unspecified: Secondary | ICD-10-CM

## 2023-02-03 LAB — POC COVID19 BINAXNOW: SARS Coronavirus 2 Ag: POSITIVE — AB

## 2023-02-03 MED ORDER — NIRMATRELVIR/RITONAVIR (PAXLOVID)TABLET: 3.0000 | ORAL_TABLET | Freq: Two times a day (BID) | ORAL | 0 refills | Status: AC

## 2023-02-03 NOTE — Progress Notes (Signed)
Patient ID: Sarah Floyd, female   DOB: [birth year], 62 y.o.   MRN: [id number]   Virtual Visit via Video Note  I connected with Brett Albino on 02/03/23 at  3:00 PM EST by a video enabled telemedicine application and verified that I am speaking with the correct person using two identifiers.  Location patient: home Location provider:work or home office Persons participating in the virtual visit: patient, provider  I discussed the limitations of evaluation and management by telemedicine and the availability of in person appointments. The patient expressed understanding and agreed to proceed.   HPI:  Ms. Shibley had onset of respiratory symptoms yesterday.  She noted some headache and took Tylenol and ibuprofen with some relief.  She had low-grade fever around 100 and some sinus congestion.  Minimal cough.  Mild chills.  No significant myalgias.  Denies any nausea, vomiting, or diarrhea.  No dyspnea.  She was at a wedding in [facility] on Friday and Saturday but no known sick contacts.  She did have COVID booster vaccine back in November.  She does take simvastatin.  Previous renal function normal.  No chronic heart or lung problems.  ROS: See pertinent positives and negatives per HPI.  Past Medical History:  Diagnosis Date   Heart murmur    as a child   Hematuria    Dr Joelyn Oms 2001 Trigonitis. Ultrasound and cystoscopy   Hormone replacement therapy (HRT) 08/30/2013   Hyperlipidemia    on meds   Hypothyroid    Sp ablation  on meds per Dr Chalmers Cater   Seasonal allergies     Past Surgical History:  Procedure Laterality Date   COLONOSCOPY  2012   DJ-F/V-movi (good)-mild tics   COLONOSCOPY WITH PROPOFOL  02/11/2022   DJ - screening   HAND SURGERY Right 2000   LIPOMA EXCISION Left 2005   buttock 3 cm    Family History  Problem Relation Age of Onset   Diabetes Mother    Diabetes Other    Hyperlipidemia Other    Heart attack Other 29       father   Thyroid disease  Other        mom    SOCIAL HX: Non-smoker   Current Outpatient Medications:    acetaminophen (TYLENOL) 325 MG tablet, Take 650 mg by mouth every 6 (six) hours as needed., Disp: , Rfl:    BALANCED B COMPLEX CR PO, , Disp: , Rfl:    CALCIUM CITRATE-VITAMIN D PO, Take 1 tablet by mouth daily at 6 (six) AM. 1000 MG OF Calcium and 500 iu of vitamin D, Disp: , Rfl:    ibuprofen (ADVIL) 200 MG tablet, Take 200 mg by mouth every 6 (six) hours as needed., Disp: , Rfl:    levothyroxine (SYNTHROID) 100 MCG tablet, TAKE 1 TABLET DAILY, Disp: 90 tablet, Rfl: 0   loratadine (CLARITIN) 10 MG tablet, Take 10 mg by mouth daily as needed for allergies., Disp: , Rfl:    Magnesium Oxide (NATRUL MAGNESIUM PO), Take 1 tablet by mouth daily at 6 (six) AM., Disp: , Rfl:    metroNIDAZOLE (METROGEL) 0.75 % gel, Apply 1 Application topically 2 (two) times daily., Disp: , Rfl:    Multiple Vitamin (MULTIVITAMIN) tablet, Take 1 tablet by mouth daily.  , Disp: , Rfl:    nirmatrelvir/ritonavir (PAXLOVID) 20 x 150 MG & 10 x '100MG'$  TABS, Take 3 tablets by mouth 2 (two) times daily for 5 days. (Take nirmatrelvir 150 mg two tablets twice  daily for 5 days and ritonavir 100 mg one tablet twice daily for 5 days) Patient GFR is 94, Disp: 30 tablet, Rfl: 0   simvastatin (ZOCOR) 40 MG tablet, Take 1 tablet (40 mg total) by mouth at bedtime., Disp: 90 tablet, Rfl: 3  EXAM:  VITALS per patient if applicable:  GENERAL: alert, oriented, appears well and in no acute distress  HEENT: atraumatic, conjunttiva clear, no obvious abnormalities on inspection of external nose and ears  NECK: normal movements of the head and neck  LUNGS: on inspection no signs of respiratory distress, breathing rate appears normal, no obvious gross SOB, gasping or wheezing  CV: no obvious cyanosis  MS: moves all visible extremities without noticeable abnormality  PSYCH/NEURO: pleasant and cooperative, no obvious depression or anxiety, speech and thought  processing grossly intact  ASSESSMENT AND PLAN:  Discussed the following assessment and plan:  COVID-19.  Onset of symptoms yesterday.  We discussed pros and cons of medical therapy with Paxlovid or molnupiravir.  She has history of normal renal function.  After much discussion we elected to send in Paxlovid 3 capsules twice daily for 5 days.  She is aware of potential side effects such as metallic aftertaste.  We also advised her to hold simvastatin while she is on the Paxlovid.  -Plenty of fluids and rest -Discussed isolation recommendations -Touch base for any increased shortness of breath or other concerns    I discussed the assessment and treatment plan with the patient. The patient was provided an opportunity to ask questions and all were answered. The patient agreed with the plan and demonstrated an understanding of the instructions.   The patient was advised to call back or seek an in-person evaluation if the symptoms worsen or if the condition fails to improve as anticipated.     Carolann Littler, MD

## 2023-03-15 ENCOUNTER — Other Ambulatory Visit: Payer: Self-pay | Admitting: Internal Medicine

## 2023-03-15 DIAGNOSIS — E039 Hypothyroidism, unspecified: Secondary | ICD-10-CM

## 2023-06-20 ENCOUNTER — Other Ambulatory Visit: Payer: Self-pay | Admitting: Family

## 2023-06-20 DIAGNOSIS — E039 Hypothyroidism, unspecified: Secondary | ICD-10-CM

## 2023-08-18 ENCOUNTER — Other Ambulatory Visit: Payer: Self-pay | Admitting: Internal Medicine

## 2023-08-18 DIAGNOSIS — E039 Hypothyroidism, unspecified: Secondary | ICD-10-CM

## 2023-08-18 DIAGNOSIS — L821 Other seborrheic keratosis: Secondary | ICD-10-CM | POA: Diagnosis not present

## 2023-08-18 DIAGNOSIS — L718 Other rosacea: Secondary | ICD-10-CM | POA: Diagnosis not present

## 2023-08-18 DIAGNOSIS — L811 Chloasma: Secondary | ICD-10-CM | POA: Diagnosis not present

## 2023-08-18 DIAGNOSIS — D2371 Other benign neoplasm of skin of right lower limb, including hip: Secondary | ICD-10-CM | POA: Diagnosis not present

## 2023-08-19 MED ORDER — LEVOTHYROXINE SODIUM 100 MCG PO TABS: ORAL_TABLET | ORAL | 0 refills | Status: DC

## 2023-08-19 NOTE — Telephone Encounter (Signed)
Spoke to pt.   Pt is taking it for 6 days a wk.  Lab appt scheduled.

## 2023-08-20 ENCOUNTER — Other Ambulatory Visit: Payer: BC Managed Care – PPO

## 2023-08-20 DIAGNOSIS — E039 Hypothyroidism, unspecified: Secondary | ICD-10-CM

## 2023-08-20 DIAGNOSIS — R739 Hyperglycemia, unspecified: Secondary | ICD-10-CM

## 2023-08-20 LAB — HEMOGLOBIN A1C: Hgb A1c MFr Bld: 6.9 % — ABNORMAL HIGH (ref 4.6–6.5)

## 2023-08-20 LAB — TSH: TSH: 6.36 u[IU]/mL — ABNORMAL HIGH (ref 0.35–5.50)

## 2023-08-20 NOTE — Addendum Note (Signed)
Addended by: Donald Pore A on: 08/20/2023 08:42 AM   Modules accepted: Orders

## 2023-08-21 NOTE — Progress Notes (Signed)
So now the tsh is on high side .  And A1c is in diabetic range 6.9   we may want to adjust and add medication . Make fu appt to discuss  we can do as virtual appt.

## 2023-08-23 NOTE — Progress Notes (Unsigned)
Virtual Visit via Video Note  I connected with Sarah Floyd on 08/24/23 at  9:15 AM EDT by a video enabled telemedicine application and verified that I am speaking with the correct person using two identifiers. Location patient: home Location provider:work  office Persons participating in the virtual visit: patient, provider  Patient aware  of the limitations of evaluation and management by telemedicine and  availability of in person appointments. and agreed to proceed.   HPI: Sarah Floyd presents for video visit   to discuss her lab results  Had over suppressed tsh last nov and was supposed to ffu but delayed at that time did feel somewhat wired no change in meds  or pills  . Did dec to 6 days per week ( not Saturday) since then felt better  Always have hard time losing weight since age 62  Fam hx of DM Not  exercising "like should"   ROS: See pertinent positives and negatives per HPI.  Past Medical History:  Diagnosis Date   Heart murmur    as a child   Hematuria    Dr Etta Grandchild 2001 Trigonitis. Ultrasound and cystoscopy   Hormone replacement therapy (HRT) 08/30/2013   Hyperlipidemia    on meds   Hypothyroid    Sp ablation  on meds per Dr Talmage Nap   Seasonal allergies     Past Surgical History:  Procedure Laterality Date   COLONOSCOPY  2012   DJ-F/V-movi (good)-mild tics   COLONOSCOPY WITH PROPOFOL  02/11/2022   DJ - screening   HAND SURGERY Right 2000   LIPOMA EXCISION Left 2005   buttock 3 cm    Family History  Problem Relation Age of Onset   Diabetes Mother    Diabetes Other    Hyperlipidemia Other    Heart attack Other 9       father   Thyroid disease Other        mom    Social History   Tobacco Use   Smoking status: Former   Smokeless tobacco: Never  Vaping Use   Vaping status: Never Used  Substance Use Topics   Alcohol use: No   Drug use: Yes    Comment: rare      Current Outpatient Medications:    acetaminophen (TYLENOL) 325 MG  tablet, Take 650 mg by mouth every 6 (six) hours as needed., Disp: , Rfl:    BALANCED B COMPLEX CR PO, , Disp: , Rfl:    CALCIUM CITRATE-VITAMIN D PO, Take 1 tablet by mouth daily at 6 (six) AM. 1000 MG OF Calcium and 500 iu of vitamin D, Disp: , Rfl:    ibuprofen (ADVIL) 200 MG tablet, Take 200 mg by mouth every 6 (six) hours as needed., Disp: , Rfl:    levothyroxine (SYNTHROID) 100 MCG tablet, TAKE 1 TABLET DAILY except Sunday., Disp: 30 tablet, Rfl: 0   loratadine (CLARITIN) 10 MG tablet, Take 10 mg by mouth daily as needed for allergies., Disp: , Rfl:    Magnesium Oxide (NATRUL MAGNESIUM PO), Take 1 tablet by mouth daily at 6 (six) AM., Disp: , Rfl:    metFORMIN (GLUCOPHAGE-XR) 500 MG 24 hr tablet, Take 1 tablet (500 mg total) by mouth daily with breakfast., Disp: 30 tablet, Rfl: 5   metroNIDAZOLE (METROGEL) 0.75 % gel, Apply 1 Application topically 2 (two) times daily., Disp: , Rfl:    Multiple Vitamin (MULTIVITAMIN) tablet, Take 1 tablet by mouth daily.  , Disp: , Rfl:  simvastatin (ZOCOR) 40 MG tablet, Take 1 tablet (40 mg total) by mouth at bedtime., Disp: 90 tablet, Rfl: 3  EXAM: BP Readings from Last 3 Encounters:  10/27/22 130/80  09/16/22 108/60  08/18/22 110/80   Wt Readings from Last 3 Encounters:  08/24/23 165 lb (74.8 kg)  02/03/23 159 lb (72.1 kg)  10/27/22 166 lb 12.8 oz (75.7 kg)     VITALS per patient if applicable:  GENERAL: alert, oriented, appears well and in no acute distress  HEENT: atraumatic, conjunttiva clear, no obvious abnormalities on inspection of external nose and ears  NECK: normal movements of the head and neck  LUNGS: on inspection no signs of respiratory distress, breathing rate appears normal, no obvious gross SOB, gasping or wheezing  CV: no obvious cyanosis  MS: moves all visible extremities without noticeable abnormality  PSYCH/NEURO: pleasant and cooperative, no obvious depression or anxiety, speech and thought processing grossly  intact Lab Results  Component Value Date   WBC 7.9 10/27/2022   HGB 13.5 10/27/2022   HCT 41.2 10/27/2022   PLT 248.0 10/27/2022   GLUCOSE 99 10/27/2022   CHOL 175 10/27/2022   TRIG 167.0 (H) 10/27/2022   HDL 51.00 10/27/2022   LDLDIRECT 124.0 10/24/2018   LDLCALC 90 10/27/2022   ALT 24 10/27/2022   AST 21 10/27/2022   NA 140 10/27/2022   K 4.0 10/27/2022   CL 105 10/27/2022   CREATININE 0.64 10/27/2022   BUN 14 10/27/2022   CO2 29 10/27/2022   TSH 6.36 (H) 08/20/2023   HGBA1C 6.9 (H) 08/20/2023    ASSESSMENT AND PLAN:  Discussed the following assessment and plan:    ICD-10-CM   1. Hypothyroidism, unspecified type  E03.9     2. Medication management  Z79.899     3. Controlled type 2 diabetes mellitus with complication, without long-term current use of insulin (HCC)  E11.8     a1c   6.9  Tsh back up to 6.36 after low 0.14  uncertain why options discussed  Suggest  adding metformin 500 er to try  ( option in addition to lsi  she may chose to not take med)  Will recheck Labs  in dec pre cpe about 3 mos Will go back to 100 per day of levothyrxoine for now  and repeat in 3 mos at cpe  labs  Disc  wants to lose weight but prefers less medicine .  Counseled.   Expectant management and discussion of plan and treatment with opportunity to ask questions and all were answered. The patient agreed with the plan and demonstrated an understanding of the instructions.   Advised to call back or seek an in-person evaluation if worsening  or having  further concerns  in interim. Return in about 3 months (around 11/23/2023) for labs pre visit  and cpe and med check .   Sarah Andreas, MD

## 2023-08-24 ENCOUNTER — Encounter: Payer: Self-pay | Admitting: Internal Medicine

## 2023-08-24 ENCOUNTER — Other Ambulatory Visit: Payer: Self-pay | Admitting: Internal Medicine

## 2023-08-24 ENCOUNTER — Telehealth: Payer: Self-pay | Admitting: Internal Medicine

## 2023-08-24 ENCOUNTER — Telehealth (INDEPENDENT_AMBULATORY_CARE_PROVIDER_SITE_OTHER): Payer: BC Managed Care – PPO | Admitting: Internal Medicine

## 2023-08-24 VITALS — Wt 165.0 lb

## 2023-08-24 DIAGNOSIS — E118 Type 2 diabetes mellitus with unspecified complications: Secondary | ICD-10-CM | POA: Diagnosis not present

## 2023-08-24 DIAGNOSIS — Z79899 Other long term (current) drug therapy: Secondary | ICD-10-CM

## 2023-08-24 DIAGNOSIS — E039 Hypothyroidism, unspecified: Secondary | ICD-10-CM

## 2023-08-24 DIAGNOSIS — E785 Hyperlipidemia, unspecified: Secondary | ICD-10-CM

## 2023-08-24 DIAGNOSIS — Z0000 Encounter for general adult medical examination without abnormal findings: Secondary | ICD-10-CM

## 2023-08-24 MED ORDER — METFORMIN HCL ER 500 MG PO TB24: 500.0000 mg | ORAL_TABLET | Freq: Every day | ORAL | 5 refills | Status: DC

## 2023-08-24 MED ORDER — LEVOTHYROXINE SODIUM 100 MCG PO TABS
ORAL_TABLET | ORAL | 0 refills | Status: DC
Start: 2023-08-24 — End: 2023-09-15

## 2023-08-24 NOTE — Telephone Encounter (Signed)
Pt is scheduled to come in for her CPE on Thursday, 11/18/23. Pt would like to come in for her labs on Monday, 11/15/23. Is MD able to put lab orders in for Pt, so that she can do her labs that Monday? Please advise.

## 2023-08-24 NOTE — Progress Notes (Signed)
Future labs ordered before next visit

## 2023-08-25 NOTE — Telephone Encounter (Signed)
Spoke to pt. Pt is aware.

## 2023-09-09 ENCOUNTER — Ambulatory Visit: Payer: BC Managed Care – PPO

## 2023-09-13 ENCOUNTER — Ambulatory Visit (INDEPENDENT_AMBULATORY_CARE_PROVIDER_SITE_OTHER): Payer: BC Managed Care – PPO | Admitting: *Deleted

## 2023-09-13 DIAGNOSIS — Z23 Encounter for immunization: Secondary | ICD-10-CM | POA: Diagnosis not present

## 2023-09-15 ENCOUNTER — Other Ambulatory Visit: Payer: Self-pay | Admitting: Family

## 2023-09-15 ENCOUNTER — Encounter: Payer: Self-pay | Admitting: Internal Medicine

## 2023-09-15 DIAGNOSIS — E039 Hypothyroidism, unspecified: Secondary | ICD-10-CM

## 2023-09-15 MED ORDER — LEVOTHYROXINE SODIUM 100 MCG PO TABS
ORAL_TABLET | ORAL | 1 refills | Status: DC
Start: 2023-09-15 — End: 2023-09-15

## 2023-09-15 MED ORDER — LEVOTHYROXINE SODIUM 100 MCG PO TABS
ORAL_TABLET | ORAL | 1 refills | Status: DC
Start: 2023-09-15 — End: 2023-11-18

## 2023-10-09 ENCOUNTER — Other Ambulatory Visit: Payer: Self-pay | Admitting: Internal Medicine

## 2023-11-02 ENCOUNTER — Encounter: Payer: BC Managed Care – PPO | Admitting: Internal Medicine

## 2023-11-15 ENCOUNTER — Other Ambulatory Visit (INDEPENDENT_AMBULATORY_CARE_PROVIDER_SITE_OTHER): Payer: BC Managed Care – PPO

## 2023-11-15 DIAGNOSIS — E039 Hypothyroidism, unspecified: Secondary | ICD-10-CM

## 2023-11-15 DIAGNOSIS — Z79899 Other long term (current) drug therapy: Secondary | ICD-10-CM | POA: Diagnosis not present

## 2023-11-15 DIAGNOSIS — E785 Hyperlipidemia, unspecified: Secondary | ICD-10-CM

## 2023-11-15 DIAGNOSIS — E118 Type 2 diabetes mellitus with unspecified complications: Secondary | ICD-10-CM | POA: Diagnosis not present

## 2023-11-15 DIAGNOSIS — Z0000 Encounter for general adult medical examination without abnormal findings: Secondary | ICD-10-CM

## 2023-11-15 LAB — CBC WITH DIFFERENTIAL/PLATELET
Basophils Absolute: 0.1 10*3/uL (ref 0.0–0.1)
Basophils Relative: 0.6 % (ref 0.0–3.0)
Eosinophils Absolute: 0.3 10*3/uL (ref 0.0–0.7)
Eosinophils Relative: 3.9 % (ref 0.0–5.0)
HCT: 40.8 % (ref 36.0–46.0)
Hemoglobin: 13.4 g/dL (ref 12.0–15.0)
Lymphocytes Relative: 36.3 % (ref 12.0–46.0)
Lymphs Abs: 3.1 10*3/uL (ref 0.7–4.0)
MCHC: 32.7 g/dL (ref 30.0–36.0)
MCV: 93 fL (ref 78.0–100.0)
Monocytes Absolute: 0.6 10*3/uL (ref 0.1–1.0)
Monocytes Relative: 7.2 % (ref 3.0–12.0)
Neutro Abs: 4.4 10*3/uL (ref 1.4–7.7)
Neutrophils Relative %: 52 % (ref 43.0–77.0)
Platelets: 265 10*3/uL (ref 150.0–400.0)
RBC: 4.39 Mil/uL (ref 3.87–5.11)
RDW: 14.2 % (ref 11.5–15.5)
WBC: 8.4 10*3/uL (ref 4.0–10.5)

## 2023-11-15 LAB — HEPATIC FUNCTION PANEL
ALT: 18 U/L (ref 0–35)
AST: 18 U/L (ref 0–37)
Albumin: 4.4 g/dL (ref 3.5–5.2)
Alkaline Phosphatase: 75 U/L (ref 39–117)
Bilirubin, Direct: 0.1 mg/dL (ref 0.0–0.3)
Total Bilirubin: 0.7 mg/dL (ref 0.2–1.2)
Total Protein: 6.9 g/dL (ref 6.0–8.3)

## 2023-11-15 LAB — BASIC METABOLIC PANEL
BUN: 16 mg/dL (ref 6–23)
CO2: 29 meq/L (ref 19–32)
Calcium: 9.8 mg/dL (ref 8.4–10.5)
Chloride: 103 meq/L (ref 96–112)
Creatinine, Ser: 0.76 mg/dL (ref 0.40–1.20)
GFR: 83.38 mL/min (ref >60.00)
Glucose, Bld: 133 mg/dL — ABNORMAL HIGH (ref 70–99)
Potassium: 4.1 meq/L (ref 3.5–5.1)
Sodium: 140 meq/L (ref 135–145)

## 2023-11-15 LAB — LIPID PANEL
Cholesterol: 198 mg/dL (ref 0–200)
HDL: 39.7 mg/dL (ref >39.00)
LDL Cholesterol: 120 mg/dL — ABNORMAL HIGH (ref 0–99)
NonHDL: 157.86
Total CHOL/HDL Ratio: 5
Triglycerides: 187 mg/dL — ABNORMAL HIGH (ref 0.0–149.0)
VLDL: 37.4 mg/dL (ref 0.0–40.0)

## 2023-11-15 LAB — HEMOGLOBIN A1C: Hgb A1c MFr Bld: 6.8 % — ABNORMAL HIGH (ref 4.6–6.5)

## 2023-11-15 LAB — MICROALBUMIN / CREATININE URINE RATIO
Creatinine,U: 201.7 mg/dL
Microalb Creat Ratio: 0.5 mg/g (ref 0.0–30.0)
Microalb, Ur: 1 mg/dL (ref 0.0–1.9)

## 2023-11-15 LAB — T4, FREE: Free T4: 1.09 ng/dL (ref 0.60–1.60)

## 2023-11-15 LAB — VITAMIN B12: Vitamin B-12: 1186 pg/mL — ABNORMAL HIGH (ref 211–911)

## 2023-11-15 LAB — TSH: TSH: 0.4 u[IU]/mL (ref 0.35–5.50)

## 2023-11-16 NOTE — Progress Notes (Signed)
Disc at upcoming visit

## 2023-11-17 NOTE — Progress Notes (Unsigned)
No chief complaint on file.   HPI: Patient  Sarah Floyd  62 y.o. comes in today for Preventive Health Care visit   Health Maintenance  Topic Date Due   FOOT EXAM  Never done   HIV Screening  Never done   Hepatitis C Screening  Never done   OPHTHALMOLOGY EXAM  07/01/2023   COVID-19 Vaccine (6 - 2024-25 season) 08/01/2023   HEMOGLOBIN A1C  05/15/2024   Diabetic kidney evaluation - eGFR measurement  11/14/2024   Diabetic kidney evaluation - Urine ACR  11/14/2024   MAMMOGRAM  12/09/2024   Cervical Cancer Screening (HPV/Pap Cotest)  03/13/2025   DTaP/Tdap/Td (3 - Td or Tdap) 10/01/2025   Colonoscopy  02/12/2032   INFLUENZA VACCINE  Completed   Zoster Vaccines- Shingrix  Completed   HPV VACCINES  Aged Out   Health Maintenance Review LIFESTYLE:  Exercise:   Tobacco/ETS: Alcohol:  Sugar beverages: Sleep: Drug use: no HH of  Work:    ROS:  REST of 12 system review negative except as per HPI   Past Medical History:  Diagnosis Date   Heart murmur    as a child   Hematuria    Dr Etta Grandchild 2001 Trigonitis. Ultrasound and cystoscopy   Hormone replacement therapy (HRT) 08/30/2013   Hyperlipidemia    on meds   Hypothyroid    Sp ablation  on meds per Dr Talmage Nap   Seasonal allergies     Past Surgical History:  Procedure Laterality Date   COLONOSCOPY  2012   DJ-F/V-movi (good)-mild tics   COLONOSCOPY WITH PROPOFOL  02/11/2022   DJ - screening   HAND SURGERY Right 2000   LIPOMA EXCISION Left 2005   buttock 3 cm    Family History  Problem Relation Age of Onset   Diabetes Mother    Diabetes Other    Hyperlipidemia Other    Heart attack Other 29       father   Thyroid disease Other        mom    Social History   Socioeconomic History   Marital status: Single    Spouse name: Not on file   Number of children: Not on file   Years of education: Not on file   Highest education level: Bachelor's degree (e.g., BA, AB, BS)  Occupational History   Not on file   Tobacco Use   Smoking status: Former   Smokeless tobacco: Never  Vaping Use   Vaping status: Never Used  Substance and Sexual Activity   Alcohol use: No   Drug use: Yes    Comment: rare   Sexual activity: Not Currently  Other Topics Concern   Not on file  Social History Narrative   hh 1  +   Pet dog  Passed in June 15    Exercise  No etoh.   Sleep  7-8 hours.      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    Social Drivers of Corporate investment banker Strain: Not on file  Food Insecurity: No Food Insecurity (09/14/2022)   Hunger Vital Sign    Worried About Running Out of Food in the Last Year: Never true    Ran Out of Food in the Last Year: Never true  Transportation Needs: No Transportation Needs (09/14/2022)   PRAPARE - Administrator, Civil Service (Medical): No    Lack of Transportation (  Non-Medical): No  Physical Activity: Insufficiently Active (09/14/2022)   Exercise Vital Sign    Days of Exercise per Week: 3 days    Minutes of Exercise per Session: 30 min  Stress: No Stress Concern Present (09/14/2022)   [employer] of Occupational Health - Occupational Stress Questionnaire    Feeling of Stress : Only a little  Social Connections: Moderately Integrated (09/14/2022)   Social Connection and Isolation Panel [NHANES]    Frequency of Communication with Friends and Family: More than three times a week    Frequency of Social Gatherings with Friends and Family: More than three times a week    Attends Religious Services: More than 4 times per year    Active Member of [employer] or Organizations: Yes    Attends Engineer, structural: More than 4 times per year    Marital Status: Never married    Outpatient Medications Prior to Visit  Medication Sig Dispense Refill   acetaminophen (TYLENOL) 325 MG tablet Take 650 mg by mouth every 6 (six) hours as needed.     BALANCED B COMPLEX CR PO      CALCIUM  CITRATE-VITAMIN D PO Take 1 tablet by mouth daily at 6 (six) AM. 1000 MG OF Calcium and 500 iu of vitamin D     ibuprofen (ADVIL) 200 MG tablet Take 200 mg by mouth every 6 (six) hours as needed.     levothyroxine (SYNTHROID) 100 MCG tablet TAKE 1 TABLET DAILY ( dose change) 90 tablet 1   loratadine (CLARITIN) 10 MG tablet Take 10 mg by mouth daily as needed for allergies.     Magnesium Oxide (NATRUL MAGNESIUM PO) Take 1 tablet by mouth daily at 6 (six) AM.     metFORMIN (GLUCOPHAGE-XR) 500 MG 24 hr tablet Take 1 tablet (500 mg total) by mouth daily with breakfast. 30 tablet 5   metroNIDAZOLE (METROGEL) 0.75 % gel Apply 1 Application topically 2 (two) times daily.     Multiple Vitamin (MULTIVITAMIN) tablet Take 1 tablet by mouth daily.       simvastatin (ZOCOR) 40 MG tablet TAKE 1 TABLET AT BEDTIME 90 tablet 3   No facility-administered medications prior to visit.     EXAM:  LMP 05/30/2012   There is no height or weight on file to calculate BMI. Wt Readings from Last 3 Encounters:  08/24/23 165 lb (74.8 kg)  02/03/23 159 lb (72.1 kg)  10/27/22 166 lb 12.8 oz (75.7 kg)    Physical Exam: Vital signs reviewed ZOX:WRUE is a well-developed well-nourished alert cooperative    who appearsr stated age in no acute distress.  HEENT: normocephalic atraumatic , Eyes: PERRL EOM's full, conjunctiva clear, Nares: paten,t no deformity discharge or tenderness., Ears: no deformity EAC's clear TMs with normal landmarks. Mouth: clear OP, no lesions, edema.  Moist mucous membranes. Dentition in adequate repair. NECK: supple without masses, thyromegaly or bruits. CHEST/PULM:  Clear to auscultation and percussion breath sounds equal no wheeze , rales or rhonchi. No chest wall deformities or tenderness. Breast: normal by inspection . No dimpling, discharge, masses, tenderness or discharge . CV: PMI is nondisplaced, S1 S2 no gallops, murmurs, rubs. Peripheral pulses are full without delay.No JVD .  ABDOMEN:  Bowel sounds normal nontender  No guard or rebound, no hepato splenomegal no CVA tenderness.  No hernia. Extremtities:  No clubbing cyanosis or edema, no acute joint swelling or redness no focal atrophy NEURO:  Oriented x3, cranial nerves 3-12 appear to be intact, no  obvious focal weakness,gait within normal limits no abnormal reflexes or asymmetrical SKIN: No acute rashes normal turgor, color, no bruising or petechiae. PSYCH: Oriented, good eye contact, no obvious depression anxiety, cognition and judgment appear normal. LN: no cervical axillary inguinal adenopathy  Lab Results  Component Value Date   WBC 8.4 11/15/2023   HGB 13.4 11/15/2023   HCT 40.8 11/15/2023   PLT 265.0 11/15/2023   GLUCOSE 133 (H) 11/15/2023   CHOL 198 11/15/2023   TRIG 187.0 (H) 11/15/2023   HDL 39.70 11/15/2023   LDLDIRECT 124.0 10/24/2018   LDLCALC 120 (H) 11/15/2023   ALT 18 11/15/2023   AST 18 11/15/2023   NA 140 11/15/2023   K 4.1 11/15/2023   CL 103 11/15/2023   CREATININE 0.76 11/15/2023   BUN 16 11/15/2023   CO2 29 11/15/2023   TSH 0.40 11/15/2023   HGBA1C 6.8 (H) 11/15/2023   MICROALBUR 1.0 11/15/2023    BP Readings from Last 3 Encounters:  10/27/22 130/80  09/16/22 108/60  08/18/22 110/80    Lab results reviewed with patient   ASSESSMENT AND PLAN:  Discussed the following assessment and plan:    ICD-10-CM   1. Visit for preventive health examination  Z00.00     2. Hypothyroidism, unspecified type  E03.9     3. Hyperlipidemia, unspecified hyperlipidemia type  E78.5     4. Hyperglycemia  R73.9     5. Murmur, heart  R01.1     ? Need updated echo No follow-ups on file.  Patient Care Team: Madelin Headings, MD as PCP - General Rachael Fee, MD (Gastroenterology) There are no Patient Instructions on file for this visit.  Neta Mends. Louise Victory M.D.

## 2023-11-18 ENCOUNTER — Encounter: Payer: Self-pay | Admitting: Internal Medicine

## 2023-11-18 ENCOUNTER — Ambulatory Visit: Payer: BC Managed Care – PPO | Admitting: Internal Medicine

## 2023-11-18 VITALS — BP 118/68 | HR 73 | Temp 97.9°F | Ht 60.0 in | Wt 169.0 lb

## 2023-11-18 DIAGNOSIS — E785 Hyperlipidemia, unspecified: Secondary | ICD-10-CM | POA: Diagnosis not present

## 2023-11-18 DIAGNOSIS — Z79899 Other long term (current) drug therapy: Secondary | ICD-10-CM

## 2023-11-18 DIAGNOSIS — R011 Cardiac murmur, unspecified: Secondary | ICD-10-CM

## 2023-11-18 DIAGNOSIS — Z0000 Encounter for general adult medical examination without abnormal findings: Secondary | ICD-10-CM

## 2023-11-18 DIAGNOSIS — E039 Hypothyroidism, unspecified: Secondary | ICD-10-CM | POA: Diagnosis not present

## 2023-11-18 DIAGNOSIS — E1165 Type 2 diabetes mellitus with hyperglycemia: Secondary | ICD-10-CM

## 2023-11-18 DIAGNOSIS — R739 Hyperglycemia, unspecified: Secondary | ICD-10-CM

## 2023-11-18 DIAGNOSIS — Z7984 Long term (current) use of oral hypoglycemic drugs: Secondary | ICD-10-CM

## 2023-11-18 MED ORDER — METFORMIN HCL ER 500 MG PO TB24: 500.0000 mg | ORAL_TABLET | Freq: Two times a day (BID) | ORAL | 3 refills | Status: DC

## 2023-11-18 MED ORDER — LEVOTHYROXINE SODIUM 100 MCG PO TABS: ORAL_TABLET | ORAL | 3 refills | Status: DC

## 2023-11-18 NOTE — Patient Instructions (Addendum)
Good to see  you today . Try increasing metformin to 2 x per day  at meals. Lifes style changes . Tracking .  Can do pap next year or no later than 2026

## 2023-12-15 ENCOUNTER — Other Ambulatory Visit: Payer: Self-pay | Admitting: Family

## 2023-12-15 ENCOUNTER — Encounter: Payer: Self-pay | Admitting: Internal Medicine

## 2023-12-15 DIAGNOSIS — Z780 Asymptomatic menopausal state: Secondary | ICD-10-CM

## 2023-12-15 DIAGNOSIS — Z1231 Encounter for screening mammogram for malignant neoplasm of breast: Secondary | ICD-10-CM | POA: Diagnosis not present

## 2023-12-15 LAB — HM MAMMOGRAPHY

## 2023-12-16 ENCOUNTER — Encounter: Payer: Self-pay | Admitting: Internal Medicine

## 2024-01-04 DIAGNOSIS — M8588 Other specified disorders of bone density and structure, other site: Secondary | ICD-10-CM | POA: Diagnosis not present

## 2024-01-04 LAB — HM DEXA SCAN

## 2024-01-05 ENCOUNTER — Encounter: Payer: Self-pay | Admitting: Internal Medicine

## 2024-01-06 NOTE — Progress Notes (Signed)
 T score -1.2

## 2024-02-15 NOTE — Progress Notes (Unsigned)
 No chief complaint on file.   HPI: Sarah Floyd 63 y.o. come in for Chronic disease management  Had pv 12 24  Thyroid DM HLD  ROS: See pertinent positives and negatives per HPI.  Past Medical History:  Diagnosis Date   Heart murmur    as a child   Hematuria    Dr Sarah Floyd 2001 Trigonitis. Ultrasound and cystoscopy   Hormone replacement therapy (HRT) 08/30/2013   Hyperlipidemia    on meds   Hypothyroid    Sp ablation  on meds per Dr Talmage Nap   Seasonal allergies     Family History  Problem Relation Age of Onset   Diabetes Mother    Diabetes Other    Hyperlipidemia Other    Heart attack Other 68       father   Thyroid disease Other        mom    Social History   Socioeconomic History   Marital status: Single    Spouse name: Not on file   Number of children: Not on file   Years of education: Not on file   Highest education level: Bachelor's degree (e.g., BA, AB, BS)  Occupational History   Not on file  Tobacco Use   Smoking status: Former   Smokeless tobacco: Never  Vaping Use   Vaping status: Never Used  Substance and Sexual Activity   Alcohol use: No   Drug use: Yes    Comment: rare   Sexual activity: Not Currently  Other Topics Concern   Not on file  Social History Narrative   hh 1  +   Pet dog  Passed in June 15    Exercise  No etoh.   Sleep  7-8 hours.      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    Social Drivers of Health   Financial Resource Strain: Low Risk  (02/15/2024)   Overall Financial Resource Strain (CARDIA)    Difficulty of Paying Living Expenses: Not hard at all  Food Insecurity: No Food Insecurity (02/15/2024)   Hunger Vital Sign    Worried About Running Out of Food in the Last Year: Never true    Ran Out of Food in the Last Year: Never true  Transportation Needs: No Transportation Needs (02/15/2024)   PRAPARE - Administrator, Civil Service (Medical): No    Lack of  Transportation (Non-Medical): No  Physical Activity: Insufficiently Active (02/15/2024)   Exercise Vital Sign    Days of Exercise per Week: 2 days    Minutes of Exercise per Session: 20 min  Stress: No Stress Concern Present (02/15/2024)   [employer] of Occupational Health - Occupational Stress Questionnaire    Feeling of Stress : Only a little  Social Connections: Moderately Integrated (02/15/2024)   Social Connection and Isolation Panel [NHANES]    Frequency of Communication with Friends and Family: More than three times a week    Frequency of Social Gatherings with Friends and Family: Twice a week    Attends Religious Services: 1 to 4 times per year    Active Member of [employer] or Organizations: Yes    Attends Engineer, structural: More than 4 times per year    Marital Status: Never married    Outpatient Medications Prior to Visit  Medication Sig Dispense Refill   acetaminophen (TYLENOL) 325 MG tablet Take 650 mg by mouth every  6 (six) hours as needed.     BALANCED B COMPLEX CR PO      CALCIUM CITRATE-VITAMIN D PO Take 1 tablet by mouth daily at 6 (six) AM. 1000 MG OF Calcium and 500 iu of vitamin D     ibuprofen (ADVIL) 200 MG tablet Take 200 mg by mouth every 6 (six) hours as needed.     levothyroxine (SYNTHROID) 100 MCG tablet TAKE 1 TABLET DAILY 90 tablet 3   loratadine (CLARITIN) 10 MG tablet Take 10 mg by mouth daily as needed for allergies.     Magnesium Oxide (NATRUL MAGNESIUM PO) Take 1 tablet by mouth daily at 6 (six) AM.     metFORMIN (GLUCOPHAGE-XR) 500 MG 24 hr tablet Take 1 tablet (500 mg total) by mouth 2 (two) times daily with a meal. 180 tablet 3   metroNIDAZOLE (METROGEL) 0.75 % gel Apply 1 Application topically 2 (two) times daily.     Multiple Vitamin (MULTIVITAMIN) tablet Take 1 tablet by mouth daily.       simvastatin (ZOCOR) 40 MG tablet TAKE 1 TABLET AT BEDTIME 90 tablet 3   No facility-administered medications prior to visit.     EXAM:  LMP  05/30/2012   There is no height or weight on file to calculate BMI.  GENERAL: vitals reviewed and listed above, alert, oriented, appears well hydrated and in no acute distress HEENT: atraumatic, conjunctiva  clear, no obvious abnormalities on inspection of external nose and ears OP : no lesion edema or exudate  NECK: no obvious masses on inspection palpation  LUNGS: clear to auscultation bilaterally, no wheezes, rales or rhonchi, good air movement CV: HRRR, no clubbing cyanosis or  peripheral edema nl cap refill  MS: moves all extremities without noticeable focal  abnormality PSYCH: pleasant and cooperative, no obvious depression or anxiety Lab Results  Component Value Date   WBC 8.4 11/15/2023   HGB 13.4 11/15/2023   HCT 40.8 11/15/2023   PLT 265.0 11/15/2023   GLUCOSE 133 (H) 11/15/2023   CHOL 198 11/15/2023   TRIG 187.0 (H) 11/15/2023   HDL 39.70 11/15/2023   LDLDIRECT 124.0 10/24/2018   LDLCALC 120 (H) 11/15/2023   ALT 18 11/15/2023   AST 18 11/15/2023   NA 140 11/15/2023   K 4.1 11/15/2023   CL 103 11/15/2023   CREATININE 0.76 11/15/2023   BUN 16 11/15/2023   CO2 29 11/15/2023   TSH 0.40 11/15/2023   HGBA1C 6.8 (H) 11/15/2023   MICROALBUR 1.0 11/15/2023   BP Readings from Last 3 Encounters:  11/18/23 118/68  10/27/22 130/80  09/16/22 108/60    ASSESSMENT AND PLAN:  Discussed the following assessment and plan:  No diagnosis found.  -Patient advised to return or notify health care team  if  new concerns arise.  There are no Patient Instructions on file for this visit.   Neta Mends. Naela Nodal M.D.

## 2024-02-16 ENCOUNTER — Encounter: Payer: Self-pay | Admitting: Internal Medicine

## 2024-02-16 ENCOUNTER — Ambulatory Visit: Payer: BC Managed Care – PPO | Admitting: Internal Medicine

## 2024-02-16 VITALS — BP 117/74 | HR 80 | Temp 98.0°F | Ht 60.0 in | Wt 167.4 lb

## 2024-02-16 DIAGNOSIS — E118 Type 2 diabetes mellitus with unspecified complications: Secondary | ICD-10-CM

## 2024-02-16 DIAGNOSIS — E785 Hyperlipidemia, unspecified: Secondary | ICD-10-CM | POA: Diagnosis not present

## 2024-02-16 DIAGNOSIS — Z7984 Long term (current) use of oral hypoglycemic drugs: Secondary | ICD-10-CM

## 2024-02-16 DIAGNOSIS — R011 Cardiac murmur, unspecified: Secondary | ICD-10-CM

## 2024-02-16 DIAGNOSIS — E039 Hypothyroidism, unspecified: Secondary | ICD-10-CM

## 2024-02-16 DIAGNOSIS — Z79899 Other long term (current) drug therapy: Secondary | ICD-10-CM | POA: Diagnosis not present

## 2024-02-16 LAB — POCT GLYCOSYLATED HEMOGLOBIN (HGB A1C): Hemoglobin A1C: 6.2 % — AB (ref 4.0–5.6)

## 2024-02-16 LAB — TSH: TSH: 0.12 u[IU]/mL — ABNORMAL LOW (ref 0.35–5.50)

## 2024-02-16 NOTE — Progress Notes (Signed)
 So  the tsh is low again ! Maybe we should just go down on the daily dose   to 88 mcg per day  Synthroid disp 90 refill x 1  and will repeat tsh  in 10-12 weeks   before next appt  in June  (goal tsh should be around  2-3 )

## 2024-02-16 NOTE — Patient Instructions (Addendum)
 Good to see you today  Rechecking thyroid  Will order echo cardiogram.  about the murmur. Update.   Plan fu in 4 months or as indicated .

## 2024-02-18 NOTE — Telephone Encounter (Signed)
 Dexa scan  still in low bone density range  but only slightly lower  Continue Weight bearing exercise  and resistance training.  Can repeat in  2-3 years

## 2024-02-22 ENCOUNTER — Other Ambulatory Visit: Payer: Self-pay

## 2024-02-22 DIAGNOSIS — E039 Hypothyroidism, unspecified: Secondary | ICD-10-CM

## 2024-02-22 MED ORDER — LEVOTHYROXINE SODIUM 88 MCG PO TABS: 88.0000 ug | ORAL_TABLET | Freq: Every day | ORAL | 1 refills | Status: DC

## 2024-03-16 ENCOUNTER — Ambulatory Visit (HOSPITAL_COMMUNITY)
Admission: RE | Admit: 2024-03-16 | Discharge: 2024-03-16 | Disposition: A | Discharge status: Home / Self Care | Source: Ambulatory Visit | Attending: Internal Medicine | Admitting: Internal Medicine

## 2024-03-16 DIAGNOSIS — I358 Other nonrheumatic aortic valve disorders: Secondary | ICD-10-CM | POA: Diagnosis not present

## 2024-03-16 DIAGNOSIS — R011 Cardiac murmur, unspecified: Secondary | ICD-10-CM | POA: Diagnosis not present

## 2024-03-16 LAB — ECHOCARDIOGRAM COMPLETE
AR max vel: 2.34 cm2
AV Area VTI: 2.27 cm2
AV Area mean vel: 2.08 cm2
AV Mean grad: 5 mmHg
AV Peak grad: 8.4 mmHg
Ao pk vel: 1.45 m/s
Area-P 1/2: 2.67 cm2
Calc EF: 59.9 %
MV VTI: 4.07 cm2
S' Lateral: 2.5 cm
Single Plane A2C EF: 62.8 %
Single Plane A4C EF: 60.2 %

## 2024-03-16 NOTE — Progress Notes (Signed)
  Echocardiogram 2D Echocardiogram has been performed.  Sarah Floyd L Sedra Morfin RDCS 03/16/2024, 3:30 PM

## 2024-03-27 ENCOUNTER — Encounter: Payer: Self-pay | Admitting: Internal Medicine

## 2024-03-27 NOTE — Progress Notes (Signed)
 Echo  shows some calcium deposits  on the aortic valve but no dysfunction or significant   heart muscle abnormalities . Pumping normal .   So  can follow exam over time  and echo if indicated to follow and recheck  . If no changing  no concerns.

## 2024-05-22 ENCOUNTER — Other Ambulatory Visit (INDEPENDENT_AMBULATORY_CARE_PROVIDER_SITE_OTHER)

## 2024-05-22 DIAGNOSIS — E039 Hypothyroidism, unspecified: Secondary | ICD-10-CM

## 2024-05-22 LAB — TSH: TSH: 0.13 u[IU]/mL — ABNORMAL LOW (ref 0.35–5.50)

## 2024-05-24 ENCOUNTER — Ambulatory Visit: Payer: Self-pay | Admitting: Internal Medicine

## 2024-05-24 NOTE — Progress Notes (Signed)
 So tsh still over suppressed  even though we decreased the dose of levothyroxine . Not sure why  but advise for now decraes the thyroid   to 6 days per week  for now and will discuss at fu visit ( please have her pick a day of not taking and update sig on medication)

## 2024-05-25 ENCOUNTER — Ambulatory Visit: Admitting: Internal Medicine

## 2024-05-25 ENCOUNTER — Other Ambulatory Visit: Payer: Self-pay | Admitting: Internal Medicine

## 2024-05-25 ENCOUNTER — Encounter: Payer: Self-pay | Admitting: Internal Medicine

## 2024-05-25 VITALS — BP 120/76 | HR 76 | Temp 98.1°F | Ht 60.0 in | Wt 165.8 lb

## 2024-05-25 DIAGNOSIS — E039 Hypothyroidism, unspecified: Secondary | ICD-10-CM

## 2024-05-25 DIAGNOSIS — Z79899 Other long term (current) drug therapy: Secondary | ICD-10-CM | POA: Diagnosis not present

## 2024-05-25 DIAGNOSIS — E118 Type 2 diabetes mellitus with unspecified complications: Secondary | ICD-10-CM

## 2024-05-25 DIAGNOSIS — R011 Cardiac murmur, unspecified: Secondary | ICD-10-CM

## 2024-05-25 DIAGNOSIS — E785 Hyperlipidemia, unspecified: Secondary | ICD-10-CM | POA: Diagnosis not present

## 2024-05-25 DIAGNOSIS — Z8639 Personal history of other endocrine, nutritional and metabolic disease: Secondary | ICD-10-CM

## 2024-05-25 LAB — POCT GLYCOSYLATED HEMOGLOBIN (HGB A1C): Hemoglobin A1C: 6.1 % — AB (ref 4.0–5.6)

## 2024-05-25 MED ORDER — LEVOTHYROXINE SODIUM 75 MCG PO TABS: 75.0000 ug | ORAL_TABLET | Freq: Every day | ORAL | 3 refills | Status: AC

## 2024-05-25 NOTE — Patient Instructions (Addendum)
 Lets decrease to 75 mcg per day . Levothyroxine  .  Contact  when running out of simvastatin  and  may change to rosuvastatin.. 10. per day.Mg   Stay on same meds . Otherwise   Thyroid   labs in 3 months after change   Otherwise   cpe December with labs

## 2024-05-25 NOTE — Progress Notes (Signed)
 Chief Complaint  Patient presents with   Medical Management of Chronic Issues    Follow up on thyroid  and DM. Would like to discuss with provider on echocardiogram.     HPI: Sarah Floyd 63 y.o. come in for Chronic disease management   DM :   Metformin  ok bid .   So far  working on  [employer] . To retire soon.  HLD : has been on simvastatin  40 for  a while  Parents able to take rosuvastatin  ( not atorvastatin)  Thyroid :  felt less  heat intolerant when doseing changed .   Orig had what sounds like ablation in about 2007 . Fam hx of thyroid   with eye sx  she never had?   ROS: See pertinent positives and negatives per HPI.  Past Medical History:  Diagnosis Date   Heart murmur    as a child   Hematuria    Dr Kathlynn 2001 Trigonitis. Ultrasound and cystoscopy   Hormone replacement therapy (HRT) 08/30/2013   Hyperlipidemia    on meds   Hypothyroid    Sp ablation  on meds per Dr Tommas   Seasonal allergies     Family History  Problem Relation Age of Onset   Diabetes Mother    Diabetes Other    Hyperlipidemia Other    Heart attack Other 44       father   Thyroid  disease Other        mom    Social History   Socioeconomic History   Marital status: Single    Spouse name: Not on file   Number of children: Not on file   Years of education: Not on file   Highest education level: Bachelor's degree (e.g., BA, AB, BS)  Occupational History   Not on file  Tobacco Use   Smoking status: Former   Smokeless tobacco: Never  Vaping Use   Vaping status: Never Used  Substance and Sexual Activity   Alcohol use: No   Drug use: Yes    Comment: rare   Sexual activity: Not Currently  Other Topics Concern   Not on file  Social History Narrative   hh 1  +   Pet dog  Passed in June 15    Exercise  No etoh.   Sleep  7-8 hours.      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    Social Drivers of Health   Financial Resource Strain: Low  Risk  (05/21/2024)   Overall Financial Resource Strain (CARDIA)    Difficulty of Paying Living Expenses: Not hard at all  Food Insecurity: No Food Insecurity (05/21/2024)   Hunger Vital Sign    Worried About Running Out of Food in the Last Year: Never true    Ran Out of Food in the Last Year: Never true  Transportation Needs: No Transportation Needs (05/21/2024)   PRAPARE - Administrator, Civil Service (Medical): No    Lack of Transportation (Non-Medical): No  Physical Activity: Insufficiently Active (05/21/2024)   Exercise Vital Sign    Days of Exercise per Week: 3 days    Minutes of Exercise per Session: 20 min  Stress: No Stress Concern Present (05/21/2024)   [employer] of Occupational Health - Occupational Stress Questionnaire    Feeling of Stress: Only a little  Social Connections: Moderately Integrated (05/21/2024)   Social Connection and Isolation Panel  Frequency of Communication with Friends and Family: More than three times a week    Frequency of Social Gatherings with Friends and Family: Once a week    Attends Religious Services: More than 4 times per year    Active Member of [employer] or Organizations: Yes    Attends Engineer, structural: More than 4 times per year    Marital Status: Never married    Outpatient Medications Prior to Visit  Medication Sig Dispense Refill   acetaminophen (TYLENOL) 325 MG tablet Take 650 mg by mouth every 6 (six) hours as needed.     BALANCED B COMPLEX CR PO Takes 4 times a day     CALCIUM CITRATE-VITAMIN D PO Take 1 tablet by mouth daily at 6 (six) AM. 1000 MG OF Calcium and 500 iu of vitamin D     ibuprofen (ADVIL) 200 MG tablet Take 200 mg by mouth every 6 (six) hours as needed.     loratadine (CLARITIN) 10 MG tablet Take 10 mg by mouth daily as needed for allergies.     Magnesium Oxide (NATRUL MAGNESIUM PO) Take 1 tablet by mouth daily at 6 (six) AM.     metFORMIN  (GLUCOPHAGE -XR) 500 MG 24 hr tablet Take 1 tablet  (500 mg total) by mouth 2 (two) times daily with a meal. 180 tablet 3   metroNIDAZOLE (METROGEL) 0.75 % gel Apply 1 Application topically 2 (two) times daily.     Multiple Vitamin (MULTIVITAMIN) tablet Take 1 tablet by mouth daily.       simvastatin  (ZOCOR ) 40 MG tablet TAKE 1 TABLET AT BEDTIME 90 tablet 3   levothyroxine  (SYNTHROID ) 88 MCG tablet Take 1 tablet (88 mcg total) by mouth daily. 90 tablet 1   No facility-administered medications prior to visit.     EXAM:  BP 120/76   Pulse 76   Temp 98.1 F (36.7 C)   Ht 5' (1.524 m)   Wt 165 lb 12.8 oz (75.2 kg)   LMP 05/30/2012   SpO2 97%   BMI 32.38 kg/m   Body mass index is 32.38 kg/m. Wt Readings from Last 3 Encounters:  05/25/24 165 lb 12.8 oz (75.2 kg)  02/16/24 167 lb 6.4 oz (75.9 kg)  11/18/23 169 lb (76.7 kg)    GENERAL: vitals reviewed and listed above, alert, oriented, appears well hydrated and in no acute distress HEENT: atraumatic, conjunctiva  clear, no obvious abnormalities on inspection of external nose and ear NECK: no obvious masses on inspection palpation  CV: HRRR, no clubbing cyanosis or  peripheral edema nl cap refill  m not heard today upright  MS: moves all extremities without noticeable focal  abnormality PSYCH: pleasant and cooperative, no obvious depression or anxiety Lab Results  Component Value Date   WBC 8.4 11/15/2023   HGB 13.4 11/15/2023   HCT 40.8 11/15/2023   PLT 265.0 11/15/2023   GLUCOSE 133 (H) 11/15/2023   CHOL 198 11/15/2023   TRIG 187.0 (H) 11/15/2023   HDL 39.70 11/15/2023   LDLDIRECT 124.0 10/24/2018   LDLCALC 120 (H) 11/15/2023   ALT 18 11/15/2023   AST 18 11/15/2023   NA 140 11/15/2023   K 4.1 11/15/2023   CL 103 11/15/2023   CREATININE 0.76 11/15/2023   BUN 16 11/15/2023   CO2 29 11/15/2023   TSH 0.13 (L) 05/22/2024   HGBA1C 6.1 (A) 05/25/2024   MICROALBUR 1.0 11/15/2023   BP Readings from Last 3 Encounters:  05/25/24 120/76  02/16/24 117/74  11/18/23  118/68    Lab review  ASSESSMENT AND PLAN:  Discussed the following assessment and plan:  Controlled type 2 diabetes mellitus with complication, without long-term current use of insulin (HCC) - Plan: POC HgB A1c  Hypothyroidism, unspecified type - acquired  ? hx of ablation2007  Hyperlipidemia, unspecified hyperlipidemia type  Medication management  Systolic murmur  Heat related sx with lower dose of synthroid     has personal hx of what sounds like  I 131 ablation and poss graves also  in family.  Will  dec dose  of synthroid  to 75 daily and check lab with  antibodies in about 3 mos.  If needed we can consult  endocrine .  Dm better  continue  Hld  family has done well on rosuvastatin  so will change to optimize at next refill  she should contact us  for order.  Plan full lab panel and  A1c at [facility] in December or as indicated.   Murmur not heart upright today  poss from mild Av calcification or was  hyperdynamic flow m  will follow exam and as indicated echo in a few years .  Retiring and  will  be paying more attention to own health .  -Patient advised to return or notify health care team  if  new concerns arise. Recordd review plan counsel and fu  orders  31 minutes  Patient Instructions  Lets decrease to 75 mcg per day . Levothyroxine  .  Contact  when running out of simvastatin  and  may change to rosuvastatin.. 10. per day.Mg   Stay on same meds . Otherwise   Thyroid   labs in 3 months after change   Otherwise   [facility] December with labs     Raychell Holcomb K. Sacoya Mcgourty M.D.

## 2024-05-30 NOTE — Progress Notes (Signed)
 [facility] Quality Team Note  Name: Sarah Floyd Date of Birth: 1960-12-13 MRN: [id number] Date: 05/30/2024  [facility] Quality Team has reviewed this patient's chart, please see recommendations below:  [facility] Quality Other; (Chart reviewed. Patient needs urine microalbumin/creatinine ratio and eGFR for gap closure.)

## 2024-08-22 ENCOUNTER — Other Ambulatory Visit (INDEPENDENT_AMBULATORY_CARE_PROVIDER_SITE_OTHER)

## 2024-08-22 DIAGNOSIS — E039 Hypothyroidism, unspecified: Secondary | ICD-10-CM | POA: Diagnosis not present

## 2024-08-22 DIAGNOSIS — Z79899 Other long term (current) drug therapy: Secondary | ICD-10-CM

## 2024-08-22 DIAGNOSIS — Z8639 Personal history of other endocrine, nutritional and metabolic disease: Secondary | ICD-10-CM | POA: Diagnosis not present

## 2024-08-22 LAB — T4, FREE: Free T4: 1.04 ng/dL (ref 0.60–1.60)

## 2024-08-22 LAB — TSH: TSH: 3.76 u[IU]/mL (ref 0.35–5.50)

## 2024-08-23 ENCOUNTER — Encounter: Payer: Self-pay | Admitting: Internal Medicine

## 2024-08-23 ENCOUNTER — Ambulatory Visit: Payer: Self-pay | Admitting: Internal Medicine

## 2024-08-23 NOTE — Progress Notes (Signed)
 Thyroid  level now in range   . Continue same  dosing  .

## 2024-08-24 DIAGNOSIS — D225 Melanocytic nevi of trunk: Secondary | ICD-10-CM | POA: Diagnosis not present

## 2024-08-24 DIAGNOSIS — D2261 Melanocytic nevi of right upper limb, including shoulder: Secondary | ICD-10-CM | POA: Diagnosis not present

## 2024-08-24 DIAGNOSIS — L811 Chloasma: Secondary | ICD-10-CM | POA: Diagnosis not present

## 2024-08-24 DIAGNOSIS — L821 Other seborrheic keratosis: Secondary | ICD-10-CM | POA: Diagnosis not present

## 2024-08-25 LAB — THYROID STIMULATING IMMUNOGLOBULIN: TSI: <89 %{baseline} (ref <140)

## 2024-08-28 MED ORDER — ROSUVASTATIN CALCIUM 10 MG PO TABS: 10.0000 mg | ORAL_TABLET | Freq: Every day | ORAL | 3 refills | Status: AC

## 2024-09-27 ENCOUNTER — Ambulatory Visit

## 2024-09-27 DIAGNOSIS — Z23 Encounter for immunization: Secondary | ICD-10-CM | POA: Diagnosis not present

## 2024-11-21 ENCOUNTER — Encounter: Payer: Self-pay | Admitting: Internal Medicine

## 2024-11-21 ENCOUNTER — Ambulatory Visit (INDEPENDENT_AMBULATORY_CARE_PROVIDER_SITE_OTHER): Admitting: Internal Medicine

## 2024-11-21 ENCOUNTER — Ambulatory Visit: Payer: Self-pay | Admitting: Internal Medicine

## 2024-11-21 VITALS — BP 118/68 | HR 73 | Temp 98.1°F | Ht 59.8 in | Wt 165.4 lb

## 2024-11-21 DIAGNOSIS — Z8639 Personal history of other endocrine, nutritional and metabolic disease: Secondary | ICD-10-CM | POA: Diagnosis not present

## 2024-11-21 DIAGNOSIS — Z0000 Encounter for general adult medical examination without abnormal findings: Secondary | ICD-10-CM

## 2024-11-21 DIAGNOSIS — E785 Hyperlipidemia, unspecified: Secondary | ICD-10-CM

## 2024-11-21 DIAGNOSIS — E039 Hypothyroidism, unspecified: Secondary | ICD-10-CM | POA: Diagnosis not present

## 2024-11-21 DIAGNOSIS — Z79899 Other long term (current) drug therapy: Secondary | ICD-10-CM | POA: Diagnosis not present

## 2024-11-21 DIAGNOSIS — E118 Type 2 diabetes mellitus with unspecified complications: Secondary | ICD-10-CM

## 2024-11-21 DIAGNOSIS — R011 Cardiac murmur, unspecified: Secondary | ICD-10-CM | POA: Diagnosis not present

## 2024-11-21 LAB — TSH: TSH: 6.58 u[IU]/mL — ABNORMAL HIGH (ref 0.35–5.50)

## 2024-11-21 LAB — CBC WITH DIFFERENTIAL/PLATELET
Basophils Absolute: 0.1 K/uL (ref 0.0–0.1)
Basophils Relative: 0.8 % (ref 0.0–3.0)
Eosinophils Absolute: 0.4 K/uL (ref 0.0–0.7)
Eosinophils Relative: 4.4 % (ref 0.0–5.0)
HCT: 41.6 % (ref 36.0–46.0)
Hemoglobin: 13.5 g/dL (ref 12.0–15.0)
Lymphocytes Relative: 33.2 % (ref 12.0–46.0)
Lymphs Abs: 2.8 K/uL (ref 0.7–4.0)
MCHC: 32.5 g/dL (ref 30.0–36.0)
MCV: 92.7 fl (ref 78.0–100.0)
Monocytes Absolute: 0.6 K/uL (ref 0.1–1.0)
Monocytes Relative: 7 % (ref 3.0–12.0)
Neutro Abs: 4.6 K/uL (ref 1.4–7.7)
Neutrophils Relative %: 54.6 % (ref 43.0–77.0)
Platelets: 215 K/uL (ref 150.0–400.0)
RBC: 4.49 Mil/uL (ref 3.87–5.11)
RDW: 14.6 % (ref 11.5–15.5)
WBC: 8.5 K/uL (ref 4.0–10.5)

## 2024-11-21 LAB — T4, FREE: Free T4: 0.84 ng/dL (ref 0.60–1.60)

## 2024-11-21 LAB — MICROALBUMIN / CREATININE URINE RATIO
Creatinine,U: 97.5 mg/dL
Microalb Creat Ratio: 7.3 mg/g (ref 0.0–30.0)
Microalb, Ur: 0.7 mg/dL (ref 0.7–1.9)

## 2024-11-21 LAB — LIPID PANEL
Cholesterol: 191 mg/dL (ref 28–200)
HDL: 49.9 mg/dL
LDL Cholesterol: 95 mg/dL (ref 10–99)
NonHDL: 140.76
Total CHOL/HDL Ratio: 4
Triglycerides: 230 mg/dL — ABNORMAL HIGH (ref 10.0–149.0)
VLDL: 46 mg/dL — ABNORMAL HIGH (ref 0.0–40.0)

## 2024-11-21 LAB — COMPREHENSIVE METABOLIC PANEL WITH GFR
ALT: 32 U/L (ref 3–35)
AST: 32 U/L (ref 5–37)
Albumin: 4.7 g/dL (ref 3.5–5.2)
Alkaline Phosphatase: 68 U/L (ref 39–117)
BUN: 14 mg/dL (ref 6–23)
CO2: 30 meq/L (ref 19–32)
Calcium: 10.1 mg/dL (ref 8.4–10.5)
Chloride: 101 meq/L (ref 96–112)
Creatinine, Ser: 0.74 mg/dL (ref 0.40–1.20)
GFR: 85.47 mL/min
Glucose, Bld: 117 mg/dL — ABNORMAL HIGH (ref 70–99)
Potassium: 4.2 meq/L (ref 3.5–5.1)
Sodium: 139 meq/L (ref 135–145)
Total Bilirubin: 0.5 mg/dL (ref 0.2–1.2)
Total Protein: 7.4 g/dL (ref 6.0–8.3)

## 2024-11-21 LAB — HEMOGLOBIN A1C: Hgb A1c MFr Bld: 6.6 % — ABNORMAL HIGH (ref 4.6–6.5)

## 2024-11-21 MED ORDER — METFORMIN HCL ER 500 MG PO TB24: 500.0000 mg | ORAL_TABLET | Freq: Two times a day (BID) | ORAL | 3 refills | Status: AC

## 2024-11-21 NOTE — Progress Notes (Signed)
 "  Chief Complaint  Patient presents with   Annual Exam    HPI: Patient  Sarah Floyd  63 y.o. comes in today for Preventive Health Care visit  And Chronic disease management DM:  doing ok  no new sx  Thyroid   following  still feels ht at times ? If could be vm sx  reote hs of hrt 10 years ago  Hdl changed to rosuvastatin  and doing ok. F/u exam murmur  no cv sx  Had ant chest rash after an event.  Used ot antihistamine and went away  used hcs with help. Eyes  stable exam   Health Maintenance  Topic Date Due   COVID-19 Vaccine (8 - 2025-26 season) 12/07/2024 (Originally 07/31/2024)   Pneumococcal Vaccine: 50+ Years (1 of 2 - PCV) 11/21/2025 (Originally 06/05/1979)   Hepatitis C Screening  11/21/2025 (Originally 06/04/1978)   HIV Screening  11/21/2025 (Originally 06/05/1975)   Cervical Cancer Screening (HPV/Pap Cotest)  03/13/2025   OPHTHALMOLOGY EXAM  04/17/2025   HEMOGLOBIN A1C  05/22/2025   DTaP/Tdap/Td (4 - Td or Tdap) 10/01/2025   Diabetic kidney evaluation - eGFR measurement  11/21/2025   Diabetic kidney evaluation - Urine ACR  11/21/2025   FOOT EXAM  11/21/2025   Mammogram  12/14/2025   Colonoscopy  02/12/2032   Influenza Vaccine  Completed   Zoster Vaccines- Shingrix  Completed   Hepatitis B Vaccines 19-59 Average Risk  Aged Out   HPV VACCINES  Aged Out   Meningococcal B Vaccine  Aged Out   Health Maintenance Review LIFESTYLE:  Exercise:   so awful    could be better  2   Hours per week Tobacco/ETS: n Alcohol:   n Sugar beverages: coffee with creamer  Sleep: just got aura ring   workring on it  7 hours  Drug use: no HH of 1   no pets  Work:   retired Copywriter, Advertising   4 hours per week.     ROS:  GEN/ HEENT: No fever, significant weight changes sweats headaches vision problems hearing changes, CV/ PULM; No chest pain shortness of breath cough, syncope,edema  change in exercise tolerance. GI /GU: No adominal pain, vomiting, change in bowel habits. No blood in the stool.  No significant GU symptoms. SKIN/HEME: ,no acute skin rashes suspicious lesions or bleeding. No lymphadenopathy, nodules, masses.  NEURO/ PSYCH:  No neurologic signs such as weakness numbness. No depression anxiety. IMM/ Allergy: No unusual infections.  Allergy .   REST of 12 system review negative except as per HPI   Past Medical History:  Diagnosis Date   Heart murmur    as a child   Hematuria    Dr Kathlynn 2001 Trigonitis. Ultrasound and cystoscopy   Hormone replacement therapy (HRT) 08/30/2013   Hyperlipidemia    on meds   Hypothyroid    Sp ablation  on meds per Dr Tommas   Seasonal allergies     Past Surgical History:  Procedure Laterality Date   COLONOSCOPY  2012   DJ-F/V-movi (good)-mild tics   COLONOSCOPY WITH PROPOFOL   02/11/2022   DJ - screening   HAND SURGERY Right 2000   LIPOMA EXCISION Left 2005   buttock 3 cm    Family History  Problem Relation Age of Onset   Diabetes Mother    Diabetes Other    Hyperlipidemia Other    Heart attack Other 50       father   Thyroid  disease Other  mom    Social History   Socioeconomic History   Marital status: Single    Spouse name: Not on file   Number of children: Not on file   Years of education: Not on file   Highest education level: Bachelor's degree (e.g., BA, AB, BS)  Occupational History   Not on file  Tobacco Use   Smoking status: Former   Smokeless tobacco: Never  Vaping Use   Vaping status: Never Used  Substance and Sexual Activity   Alcohol use: No   Drug use: Yes    Comment: rare   Sexual activity: Not Currently  Other Topics Concern   Not on file  Social History Narrative   hh 1  +   Pet dog  Passed in June 15    Exercise  No etoh.   Sleep  7-8 hours.      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    Social Drivers of Health   Tobacco Use: Medium Risk (11/21/2024)   Patient History    Smoking Tobacco Use: Former    Smokeless Tobacco  Use: Never    Passive Exposure: Not on file  Financial Resource Strain: Low Risk (11/17/2024)   Overall Financial Resource Strain (CARDIA)    Difficulty of Paying Living Expenses: Not hard at all  Food Insecurity: No Food Insecurity (11/17/2024)   Epic    Worried About Programme Researcher, Broadcasting/film/video in the Last Year: Never true    Ran Out of Food in the Last Year: Never true  Transportation Needs: No Transportation Needs (11/17/2024)   Epic    Lack of Transportation (Medical): No    Lack of Transportation (Non-Medical): No  Physical Activity: Insufficiently Active (11/17/2024)   Exercise Vital Sign    Days of Exercise per Week: 2 days    Minutes of Exercise per Session: 20 min  Stress: No Stress Concern Present (11/17/2024)   [employer] of Occupational Health - Occupational Stress Questionnaire    Feeling of Stress: Only a little  Social Connections: Moderately Integrated (11/17/2024)   Social Connection and Isolation Panel    Frequency of Communication with Friends and Family: More than three times a week    Frequency of Social Gatherings with Friends and Family: Once a week    Attends Religious Services: More than 4 times per year    Active Member of Clubs or Organizations: Yes    Attends Banker Meetings: More than 4 times per year    Marital Status: Never married  Depression (PHQ2-9): Low Risk (11/21/2024)   Depression (PHQ2-9)    PHQ-2 Score: 0  Alcohol Screen: Low Risk (05/21/2024)   Alcohol Screen    Last Alcohol Screening Score (AUDIT): 1  Housing: Low Risk (11/17/2024)   Epic    Unable to Pay for Housing in the Last Year: No    Number of Times Moved in the Last Year: 0    Homeless in the Last Year: No  Utilities: Not on file  Health Literacy: Not on file    Outpatient Medications Prior to Visit  Medication Sig Dispense Refill   acetaminophen (TYLENOL) 325 MG tablet Take 650 mg by mouth every 6 (six) hours as needed.     BALANCED B COMPLEX CR PO Takes 4  times a day     CALCIUM  CITRATE-VITAMIN D PO Take 1 tablet by mouth daily at 6 (six) AM. 1000 MG  OF Calcium  and 500 iu of vitamin D     ibuprofen (ADVIL) 200 MG tablet Take 200 mg by mouth every 6 (six) hours as needed.     levothyroxine  (SYNTHROID ) 75 MCG tablet Take 1 tablet (75 mcg total) by mouth daily. Dosage change 90 tablet 3   loratadine (CLARITIN) 10 MG tablet Take 10 mg by mouth daily as needed for allergies.     Magnesium Oxide (NATRUL MAGNESIUM PO) Take 1 tablet by mouth daily at 6 (six) AM.     metroNIDAZOLE (METROGEL) 0.75 % gel Apply 1 Application topically 2 (two) times daily.     Multiple Vitamin (MULTIVITAMIN) tablet Take 1 tablet by mouth daily.       rosuvastatin  (CRESTOR ) 10 MG tablet Take 1 tablet (10 mg total) by mouth daily. Change from simvastatin  90 tablet 3   metFORMIN  (GLUCOPHAGE -XR) 500 MG 24 hr tablet Take 1 tablet (500 mg total) by mouth 2 (two) times daily with a meal. 180 tablet 3   No facility-administered medications prior to visit.     EXAM:  BP 118/68 (BP Location: Left Arm, Patient Position: Sitting, Cuff Size: Large)   Pulse 73   Temp 98.1 F (36.7 C) (Oral)   Ht 4' 11.8 (1.519 m)   Wt 165 lb 6.4 oz (75 kg)   LMP 05/30/2012   SpO2 96%   BMI 32.52 kg/m   Body mass index is 32.52 kg/m. Wt Readings from Last 3 Encounters:  11/21/24 165 lb 6.4 oz (75 kg)  05/25/24 165 lb 12.8 oz (75.2 kg)  02/16/24 167 lb 6.4 oz (75.9 kg)    Physical Exam: Vital signs reviewed HZW:Uypd is a well-developed well-nourished alert cooperative    who appearsr stated age in no acute distress.  HEENT: normocephalic atraumatic , Eyes: PERRL EOM's full, conjunctiva clear, Nares: paten,t no deformity discharge or tenderness., Ears: no deformity EAC's clear TMs with normal landmarks. Mouth: clear OP, no lesions, edema.  Moist mucous membranes. Dentition in adequate repair. NECK: supple without masses, thyromegaly or bruits. CHEST/PULM:  Clear to auscultation and  percussion breath sounds equal no wheeze , rales or rhonchi. No chest wall deformities or tenderness. Breast: normal by inspection . No dimpling, discharge, masses, tenderness or discharge . CV: PMI is nondisplaced, S1 S2 no gallops,  faint only in supine position sem rand upper  murmur chest   no click or  rubs. Peripheral pulses are full without delay.No JVD .  ABDOMEN: Bowel sounds normal nontender  No guard or rebound, no hepato splenomegal no CVA tenderness.  Extremtities:  No clubbing cyanosis or edema, no acute joint swelling or redness no focal atrophy NEURO:  Oriented x3, cranial nerves 3-12 appear to be intact, no obvious focal weakness,gait within normal limits no abnormal reflexes or asymmetrical SKIN: No acute rashes normal turgor, color, no bruising or petechiae. PSYCH: Oriented, good eye contact, no obvious depression anxiety, cognition and judgment appear normal. LN: no cervical axillary i adenopathy   Diabetic foot exam was performed with the following findings:   No deformities, ulcerations, or other skin breakdown Normal sensation of 10g monofilament Intact posterior tibialis and dorsalis pedis pulses     Lab Results  Component Value Date   WBC 8.5 11/21/2024   HGB 13.5 11/21/2024   HCT 41.6 11/21/2024   PLT 215.0 11/21/2024   GLUCOSE 117 (H) 11/21/2024   CHOL 191 11/21/2024   TRIG 230.0 (H) 11/21/2024   HDL 49.90 11/21/2024   LDLDIRECT 124.0 10/24/2018  LDLCALC 95 11/21/2024   ALT 32 11/21/2024   AST 32 11/21/2024   NA 139 11/21/2024   K 4.2 11/21/2024   CL 101 11/21/2024   CREATININE 0.74 11/21/2024   BUN 14 11/21/2024   CO2 30 11/21/2024   TSH 6.58 (H) 11/21/2024   HGBA1C 6.6 (H) 11/21/2024   MICROALBUR 0.7 11/21/2024    BP Readings from Last 3 Encounters:  11/21/24 118/68  05/25/24 120/76  02/16/24 117/74    Lab plan  reviewed with patient   ASSESSMENT AND PLAN:  Discussed the following assessment and plan:    ICD-10-CM   1. Visit for  preventive health examination  Z00.00 CBC with Differential/Platelet    Comprehensive metabolic panel with GFR    Lipid panel    TSH    Hemoglobin A1c    Microalbumin / creatinine urine ratio    T4, Free    2. Hyperlipidemia, unspecified hyperlipidemia type  E78.5 CBC with Differential/Platelet    Comprehensive metabolic panel with GFR    Lipid panel    TSH    Hemoglobin A1c    Microalbumin / creatinine urine ratio    T4, Free    3. Medication management  Z79.899 CBC with Differential/Platelet    Comprehensive metabolic panel with GFR    Lipid panel    TSH    Hemoglobin A1c    Microalbumin / creatinine urine ratio    T4, Free    4. Controlled type 2 diabetes mellitus with complication, without long-term current use of insulin (HCC)  E11.8 CBC with Differential/Platelet    Comprehensive metabolic panel with GFR    Lipid panel    TSH    Hemoglobin A1c    Microalbumin / creatinine urine ratio    T4, Free    5. History of hyperthyroidism  Z86.39 CBC with Differential/Platelet    Comprehensive metabolic panel with GFR    Lipid panel    TSH    Hemoglobin A1c    Microalbumin / creatinine urine ratio    T4, Free    6. Hypothyroidism, unspecified type  E03.9 CBC with Differential/Platelet    Comprehensive metabolic panel with GFR    Lipid panel    TSH    Hemoglobin A1c    Microalbumin / creatinine urine ratio    T4, Free    7. Murmur, heart  R01.1    minimal heard today  no sx    Warm ? Menopausal or other controlled checked aura ring and sleep seems ok  Will optimize for now  Due for pap and dexa scan next year . Or as indicated  Optimize activity diet also  Updating labs today  for monitoring  Heart m barely heard and only in supine non radiating The rash  may have been a new topical  disc  follow and avoidance   Return in about 6 months (around 05/22/2025) for depending on results.  Patient Care Team: Yaris Ferrell K, MD as PCP - General Teressa Toribio SQUIBB, MD  (Inactive) (Gastroenterology) [facility] Patient Instructions  Good to see  you today .  Lab today and go from there  If all ok then plan 4-6 mos visit check .  Add resistance exercise  strengthening activity to help maintain muscle mass.  Plan pap and dexa scan next year.  It is still possible that hrt may help sleep but uncertain of risk benefit at this time .  Continue lifestyle changes and optimize .   Daijah Scrivens K.  Kirk Sampley M.D.  "

## 2024-11-21 NOTE — Patient Instructions (Addendum)
 Good to see  you today .  Lab today and go from there  If all ok then plan 4-6 mos visit check .  Add resistance exercise  strengthening activity to help maintain muscle mass.  Plan pap and dexa scan next year.  It is still possible that hrt may help sleep but uncertain of risk benefit at this time .  Continue lifestyle changes and optimize .

## 2024-11-21 NOTE — Progress Notes (Signed)
 Lipids are improved  although not at goal yet  ldl is 90 and best at 70  J8r is ok  up to 6.6 kidney function ok  Thyroid  is off   if   you have been taking  the thyroid   med without missing doses then we should increase your medication dose. Begin  synthroid  levothyroxin eto 88 mcg per day  disp 90 refill x 1   If you have missed some doses  make sure you take regular  and do labs anyway in 3 months  Check tsh and free t4 in 3 months

## 2024-11-22 MED ORDER — LEVOTHYROXINE SODIUM 88 MCG PO TABS: 88.0000 ug | ORAL_TABLET | Freq: Every day | ORAL | 1 refills | Status: AC

## 2024-12-20 LAB — HM MAMMOGRAPHY

## 2024-12-21 ENCOUNTER — Encounter: Payer: Self-pay | Admitting: Internal Medicine

## 2024-12-26 NOTE — Telephone Encounter (Signed)
 I see they  just weren't sure  if abnormal and maybe just needs more views. Sarah Floyd if contact by the mail  is causing the delay .   Karpuih please contact [facility] to report that she needs a follow up and she hasn't been contacted yet.  Patient can also call them.

## 2024-12-26 NOTE — Telephone Encounter (Signed)
Form signed & to be faxed.

## 2025-01-04 ENCOUNTER — Encounter: Payer: Self-pay | Admitting: Internal Medicine

## 2025-02-19 ENCOUNTER — Other Ambulatory Visit
